# Patient Record
Sex: Male | Born: 1990 | Race: Black or African American | Hispanic: No | Marital: Single | Smoking: Current every day smoker
Health system: Southern US, Community
[De-identification: ages and names within clinical notes are randomized; demographics above are authoritative.]

## PROBLEM LIST (undated history)

## (undated) DIAGNOSIS — N200 Calculus of kidney: Secondary | ICD-10-CM

---
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
## 2011-03-23 ENCOUNTER — Emergency Department (HOSPITAL_COMMUNITY): Payer: BC Managed Care – PPO

## 2011-03-23 ENCOUNTER — Encounter: Payer: Self-pay | Admitting: Emergency Medicine

## 2011-03-23 ENCOUNTER — Emergency Department (HOSPITAL_COMMUNITY)
Admission: EM | Admit: 2011-03-23 | Discharge: 2011-03-23 | Disposition: A | Discharge status: Home / Self Care | Payer: BC Managed Care – PPO | Attending: Emergency Medicine | Admitting: Emergency Medicine

## 2011-03-23 DIAGNOSIS — R1110 Vomiting, unspecified: Secondary | ICD-10-CM

## 2011-03-23 DIAGNOSIS — R112 Nausea with vomiting, unspecified: Secondary | ICD-10-CM | POA: Insufficient documentation

## 2011-03-23 DIAGNOSIS — M546 Pain in thoracic spine: Secondary | ICD-10-CM | POA: Insufficient documentation

## 2011-03-23 DIAGNOSIS — M549 Dorsalgia, unspecified: Secondary | ICD-10-CM

## 2011-03-23 LAB — URINALYSIS, ROUTINE W REFLEX MICROSCOPIC
Bilirubin Urine: NEGATIVE
Glucose, UA: NEGATIVE mg/dL
Ketones, ur: 15 mg/dL — AB
Nitrite: NEGATIVE
Protein, ur: 30 mg/dL — AB
Specific Gravity, Urine: 1.025 (ref 1.005–1.030)
Urobilinogen, UA: 0.2 mg/dL (ref 0.0–1.0)
pH: 8 (ref 5.0–8.0)

## 2011-03-23 LAB — URINE MICROSCOPIC-ADD ON

## 2011-03-23 MED ORDER — ONDANSETRON 4 MG PO TBDP
4.0000 mg | ORAL_TABLET | Freq: Once | ORAL | Status: AC
  Administered 2011-03-23: 4 mg via ORAL
  Filled 2011-03-23: qty 1

## 2011-03-23 MED ORDER — DICYCLOMINE HCL 10 MG PO CAPS
10.0000 mg | ORAL_CAPSULE | Freq: Once | ORAL | Status: AC
  Administered 2011-03-23: 10 mg via ORAL
  Filled 2011-03-23: qty 1

## 2011-03-23 MED ORDER — ONDANSETRON HCL 4 MG PO TABS: 4.0000 mg | ORAL_TABLET | Freq: Four times a day (QID) | ORAL | Status: AC

## 2011-03-23 NOTE — ED Provider Notes (Signed)
History    20yM with n/v and back pain. Onset around 0900 today with vomiting. Multiple episodes. Triage note reviewed, but reported hx to me that around 1100 began having pain in R mid back. Worse with deep breaths and movement. Feels like something twisting. No fever or chills. Multiple episodes of vomiting. NBNB. No sick contacts. No urinary complaints. Denies hx of abdominal surgery. No significant PMHx.   CSN: [id number]  Arrival date & time 03/23/11  1252   First MD Initiated Contact with Patient 03/23/11 1447      Chief Complaint  Patient presents with  . Back Pain    (Consider location/radiation/quality/duration/timing/severity/associated sxs/prior treatment) HPI  History reviewed. No pertinent past medical history.  History reviewed. No pertinent past surgical history.  History reviewed. No pertinent family history.  History  Substance Use Topics  . Smoking status: Former Games developer  . Smokeless tobacco: Never Used  . Alcohol Use: Yes      Review of Systems   Review of symptoms negative unless otherwise noted in HPI.   Allergies  Amoxicillin  Home Medications  No current outpatient prescriptions on file.  BP 107/71  Pulse 67  Temp(Src) 97.5 F (36.4 C) (Oral)  Resp 16  SpO2 100%  Physical Exam  Nursing note and vitals reviewed. Constitutional: He appears well-developed and well-nourished. No distress.       Laying in bed. NAD.  HENT:  Head: Normocephalic and atraumatic.  Eyes: Conjunctivae are normal. Right eye exhibits no discharge. Left eye exhibits no discharge.  Neck: Neck supple.  Cardiovascular: Normal rate, regular rhythm and normal heart sounds.  Exam reveals no gallop and no friction rub.   No murmur heard. Pulmonary/Chest: Effort normal and breath sounds normal. No respiratory distress.  Abdominal: Soft. He exhibits no distension and no mass. There is no tenderness. There is no rebound and no guarding.  Genitourinary:       No cva  tenderness  Musculoskeletal: He exhibits no edema and no tenderness.  Neurological: He is alert.  Skin: Skin is warm and dry.  Psychiatric: He has a normal mood and affect. His behavior is normal. Thought content normal.    ED Course  Procedures (including critical care time)   Labs Reviewed  URINALYSIS, ROUTINE W REFLEX MICROSCOPIC   No results found.   1. Back pain   2. Vomiting       MDM  20yM with back pain and vomiting. Suspect viral illness. RBC in urine. CT done to eval for possible stone and no evidence of other explanation for symptoms. Pt feels better after tx. No continued vomiting. Nontoxic. Feel that can safely be DC'd with outpt fu as needed. Strict return precautions discussed.        Raeford Razor, MD 03/27/11 [phone]

## 2011-03-23 NOTE — ED Notes (Signed)
Pt reports left low back pain onset 1100 today with n/v.

## 2011-04-15 ENCOUNTER — Encounter (HOSPITAL_COMMUNITY): Payer: Self-pay

## 2011-04-15 ENCOUNTER — Emergency Department (INDEPENDENT_AMBULATORY_CARE_PROVIDER_SITE_OTHER)
Admission: EM | Admit: 2011-04-15 | Discharge: 2011-04-15 | Disposition: A | Discharge status: Home / Self Care | Payer: BC Managed Care – PPO | Source: Home / Self Care | Attending: Family Medicine | Admitting: Family Medicine

## 2011-04-15 DIAGNOSIS — B86 Scabies: Secondary | ICD-10-CM

## 2011-04-15 MED ORDER — PERMETHRIN 5 % EX CREA: TOPICAL_CREAM | CUTANEOUS | Status: AC

## 2011-04-15 NOTE — ED Notes (Signed)
Past couple of days, has noted reddened areas, bumps, bites on body (hands, groin, legs) NAD, has not seen any insects in home

## 2011-04-15 NOTE — ED Provider Notes (Signed)
History     CSN: [id number]  Arrival date & time 04/15/11  1123   First MD Initiated Contact with Patient 04/15/11 1258      Chief Complaint  Patient presents with  . Rash    (Consider location/radiation/quality/duration/timing/severity/associated sxs/prior treatment) Patient is a 21 y.o. male presenting with rash. The history is provided by the patient.  Rash  This is a new problem. The current episode started more than 1 week ago (3 week h/o spreading bumps.). The problem has been gradually worsening. The problem is associated with an unknown factor. There has been no fever. The rash is present on the right hand, left hand, groin, right wrist, left wrist, left fingers and right fingers. The patient is experiencing no pain. The pain has been constant since onset. Associated symptoms include itching.    History reviewed. No pertinent past medical history.  History reviewed. No pertinent past surgical history.  History reviewed. No pertinent family history.  History  Substance Use Topics  . Smoking status: Former Games developer  . Smokeless tobacco: Never Used  . Alcohol Use: Yes      Review of Systems  Constitutional: Negative.   Skin: Positive for itching and rash.    Allergies  Amoxicillin  Home Medications   Current Outpatient Rx  Name Route Sig Dispense Refill  . PERMETHRIN 5 % EX CREA  Apply over body at bedtime, wash off in am, repeat in 1 week. 60 g 1    BP 109/67  Pulse 60  Temp(Src) 98 F (36.7 C) (Oral)  Resp 14  SpO2 100%  Physical Exam  Nursing note and vitals reviewed. Constitutional: He is oriented to person, place, and time. He appears well-developed and well-nourished.  Musculoskeletal: Normal range of motion.  Neurological: He is alert and oriented to person, place, and time.  Skin: Skin is warm and dry. Rash noted. Rash is papular.       ED Course  Procedures (including critical care time)  Labs Reviewed - No data to display No results  found.   1. Scabies       MDM          Barkley Bruns, MD 04/15/11 1344

## 2011-05-21 ENCOUNTER — Encounter: Payer: Self-pay | Admitting: Family Medicine

## 2011-05-21 ENCOUNTER — Ambulatory Visit (INDEPENDENT_AMBULATORY_CARE_PROVIDER_SITE_OTHER): Payer: BC Managed Care – PPO | Admitting: Family Medicine

## 2011-05-21 VITALS — BP 112/59 | HR 67 | Temp 98.2°F | Ht 67.0 in | Wt 132.5 lb

## 2011-05-21 DIAGNOSIS — B354 Tinea corporis: Secondary | ICD-10-CM

## 2011-05-21 DIAGNOSIS — R21 Rash and other nonspecific skin eruption: Secondary | ICD-10-CM

## 2011-05-21 LAB — HIV ANTIBODY (ROUTINE TESTING W REFLEX): HIV: NONREACTIVE

## 2011-05-21 LAB — POCT SKIN KOH: Skin KOH, POC: NEGATIVE

## 2011-05-21 MED ORDER — CLOTRIMAZOLE-BETAMETHASONE 1-0.05 % EX LOTN: TOPICAL_LOTION | Freq: Two times a day (BID) | CUTANEOUS | Status: DC

## 2011-05-21 NOTE — Patient Instructions (Addendum)
Fungus Infection of the Skin An infection of your skin caused by a fungus is a very common problem. Treatment depends on which part of the body is affected.  Fungal infections may need to be treated for several weeks to be cured. SEEK MEDICAL CARE IF:   You have persistent itching or rawness.   You have an oral temperature above 102 F (38.9 C).  Document Released: 04/18/2004 Document Revised: 11/21/2010 Document Reviewed: 07/04/2009 [facility] Patient Information 2012 [place], [place].

## 2011-05-21 NOTE — Progress Notes (Signed)
  Subjective:    Patient ID: Jeffrey Bautista, male    DOB: Aug 30, 1990, 20 y.o.   MRN: [id number]  HPI  Pt that comes to establish care and his main complaint at this visit are skin lesions that had appeared since last December. They first started on his lower extremities and had been extending to his upper extremities and now on chest and and back. The lesions are very pruriginous. Patient used moisturizing lotions with no relief of symptoms. Afebrile. Good appetite. No other complaints. No urinary symptoms, normal bowel movements.   Review of Systems  Constitutional: Negative for fever, chills, activity change, appetite change and fatigue.  HENT: Negative.   Eyes: Negative.   Respiratory: Negative.   Cardiovascular: Negative.   Gastrointestinal: Negative.   Genitourinary: Negative.   Musculoskeletal: Negative.   Skin: Positive for rash.  Neurological: Negative.   Hematological: Negative.        Objective:   Physical Exam  Constitutional: He is oriented to person, place, and time. No distress.  HENT:  Mouth/Throat: Oropharynx is clear and moist.  Eyes: Conjunctivae are normal.  Neck: Neck supple.  Cardiovascular: Normal rate, regular rhythm and normal heart sounds.   No murmur heard. Pulmonary/Chest: Breath sounds normal.  Abdominal: Bowel sounds are normal. He exhibits no distension.  Musculoskeletal: He exhibits no edema.  Neurological: He is alert and oriented to person, place, and time.  Skin:       Pruritic, circular to oval, erythematous, scaling plaques with central clearing. Active advancing border erythematous and raised.    Labs HIV non reactive. KOH skin.  Negative Pt partially treated this increased false negative results.    Assessment & Plan:

## 2011-05-22 MED ORDER — FLUCONAZOLE 150 MG PO TABS: 150.0000 mg | ORAL_TABLET | Freq: Once | ORAL | Status: AC

## 2011-05-22 NOTE — Assessment & Plan Note (Addendum)
Even though skin KOH was negative pt clinically presents with lesions that resemble this condition. Pt was partially treated with some antifungal, did not completed treatment. He has lesions presents on his back (hard to reach for topical treatment) Plan: Topical Lotrisone, and fluconazole 150 mg weekly for 2 weeks. F/u in two weeks if not improvement.

## 2011-06-04 ENCOUNTER — Encounter: Payer: Self-pay | Admitting: Family Medicine

## 2011-06-04 ENCOUNTER — Ambulatory Visit (INDEPENDENT_AMBULATORY_CARE_PROVIDER_SITE_OTHER): Payer: BC Managed Care – PPO | Admitting: Family Medicine

## 2011-06-04 DIAGNOSIS — L259 Unspecified contact dermatitis, unspecified cause: Secondary | ICD-10-CM

## 2011-06-04 DIAGNOSIS — R21 Rash and other nonspecific skin eruption: Secondary | ICD-10-CM | POA: Insufficient documentation

## 2011-06-04 MED ORDER — TRIAMCINOLONE ACETONIDE 0.1 % EX CREA: TOPICAL_CREAM | Freq: Two times a day (BID) | CUTANEOUS | Status: DC

## 2011-06-04 NOTE — Assessment & Plan Note (Signed)
Vesicular rash in between fingers most likely id reaction after having extensive tinea corporis now resolved.  Also possibly contact dermatitis, less likely dyshidrotic eczema.   Not painful or red- does not look like hsv, no pustules.  Will change Lotrisone to triamcinolone for symptomatic of itching.  Given red flags for return.  Expect will continue to resolve.

## 2011-06-04 NOTE — Patient Instructions (Addendum)
Think about things that may be triggering reaction- exposure to soaps, chemicals, detergents  Use triamcinolone cream twice a day as needed to help with itching, then discontinue  Consider trying an allergy medication like zyrtec or claritin

## 2011-06-04 NOTE — Progress Notes (Signed)
  Subjective:    Patient ID: Jeffrey Bautista, male    DOB: 28-Feb-1991, 21 y.o.   MRN: [id number]  HPI 21 yo here for follow-up of rash.  States ongoing for past 2 months.  Was diagnosed with scabies at urgent care and given permethrin without improvement.  Was later seen by his PCP and dx with tinea corporis and was given oral antifungal and lotrisone and had a negative HIV test.  Patient states started off as rash on legs, arms and trunk and hands.  Now much improved but still having itching on hands, and arms which did seem to improve with lotrisone.  No pain, very itchy.  I have reviewed patient's  PMH, FH, and Social history and Medications as related to this visit. States has childhood eczema  Review of Systemsno fever, chills     Objective:   Physical Exam GEN: NAD, healthy appearing Skin:  Trunk: hyperpigmented patches consistent with areas he states were previously scaling and itchy now resolving.  Several small vesicles clustered in interdigital web spaces of right hand.  Not on erythematous base. No painful.         Assessment & Plan:

## 2012-01-22 ENCOUNTER — Ambulatory Visit (INDEPENDENT_AMBULATORY_CARE_PROVIDER_SITE_OTHER): Payer: BC Managed Care – PPO | Admitting: Family Medicine

## 2012-01-22 ENCOUNTER — Encounter: Payer: Self-pay | Admitting: Family Medicine

## 2012-01-22 VITALS — BP 99/63 | HR 55 | Temp 98.3°F | Ht 67.0 in | Wt 129.0 lb

## 2012-01-22 DIAGNOSIS — S01501A Unspecified open wound of lip, initial encounter: Secondary | ICD-10-CM

## 2012-01-22 DIAGNOSIS — S01511A Laceration without foreign body of lip, initial encounter: Secondary | ICD-10-CM

## 2012-01-22 MED ORDER — BACITRACIN 500 UNIT/GM EX OINT: 1.0000 "application " | TOPICAL_OINTMENT | Freq: Two times a day (BID) | CUTANEOUS | Status: DC
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
## 2012-01-22 NOTE — Patient Instructions (Addendum)

## 2012-01-30 ENCOUNTER — Ambulatory Visit: Payer: BC Managed Care – PPO | Admitting: Family Medicine

## 2012-02-03 ENCOUNTER — Encounter: Payer: Self-pay | Admitting: Family Medicine

## 2012-02-03 ENCOUNTER — Ambulatory Visit (INDEPENDENT_AMBULATORY_CARE_PROVIDER_SITE_OTHER): Payer: BC Managed Care – PPO | Admitting: Family Medicine

## 2012-02-03 VITALS — BP 103/61 | HR 81 | Temp 98.0°F | Ht 67.0 in | Wt 130.0 lb

## 2012-02-03 DIAGNOSIS — M5430 Sciatica, unspecified side: Secondary | ICD-10-CM

## 2012-02-03 DIAGNOSIS — M5432 Sciatica, left side: Secondary | ICD-10-CM | POA: Insufficient documentation

## 2012-02-03 MED ORDER — NAPROXEN 500 MG PO TABS: 500.0000 mg | ORAL_TABLET | Freq: Two times a day (BID) | ORAL | Status: DC

## 2012-02-03 NOTE — Progress Notes (Signed)
  Subjective:    Patient ID: Jeffrey Bautista, male    DOB: [birth year], 21 y.o.   MRN: [id number]  HPI Left leg pain starting about a week ago. Pain is sharp/shooting/stabbing. Goes from low back, side, back of leg to foot. Also c/o thigh pain lateral and medial - starts near lateral knee. Sore to touch up to groin. Walking, lifting make both pain worse. No rash. 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 Used to use back support belt/brace but doesn't any more. No new activities or known trauma to back/leg. Did have injury to food a few weeks ago. Box dropped on the back of his heel; this no longer hurts.   Review of Systems  Constitutional: Negative for fever and chills.  HENT: Negative for neck pain and neck stiffness.   Genitourinary: Negative for dysuria and difficulty urinating.  Musculoskeletal: Positive for myalgias and back pain. Negative for joint swelling and gait problem.  Neurological: Negative for dizziness, weakness and numbness.       Objective:   Physical Exam  Constitutional: He is oriented to person, place, and time. He appears well-developed and well-nourished. No distress.  HENT:  Head: Normocephalic and atraumatic.  Cardiovascular: Normal rate.   Pulmonary/Chest: Effort normal.  Musculoskeletal:       Tender over lower lumbar area - left paraspinous muscles and iliac crest. L foot, ankle, knee non-tender, normal range of motion, no deformity. Tenderness of lateral thigh muscle just above knee and of medial thigh proximally to groin. No inguinal adenopathy. No swelling, redness, warmth. No abrasions/lesions/lumps/masses or bruising.  Neurological: He is alert and oriented to person, place, and time. He exhibits normal muscle tone.  Skin: Skin is warm and dry.  Psychiatric: He has a normal mood and affect.   Tender on left lower back/flank, thigh inner and outer to knee. Calf non-tender. Full range of motno      Assessment & Plan:    21 y.o. male with lumbar muscle strain/sciatica, muscle strain of left thigh - possibly aggravated by limping due to left foot injury and work-related lifting.  Naproxen, heat, massage Decrease lifting at work for a few days to weeks Use back support belt/brace  Napoleon Form, MD 02/03/2012 4:16 PM

## 2012-02-03 NOTE — Assessment & Plan Note (Signed)
Naproxen, use support belt at work, rest from heavy lifting if possible Heat, massage.

## 2012-02-03 NOTE — Patient Instructions (Addendum)
Lumbosacral Strain Lumbosacral strain is one of the most common causes of back pain. There are many causes of back pain. Most are not serious conditions. CAUSES  Your backbone (spinal column) is made up of 24 main vertebral bodies, the sacrum, and the coccyx. These are held together by muscles and tough, fibrous tissue (ligaments). Nerve roots pass through the openings between the vertebrae. A sudden move or injury to the back may cause injury to, or pressure on, these nerves. This may result in localized back pain or pain movement (radiation) into the buttocks, down the leg, and into the foot. Sharp, shooting pain from the buttock down the back of the leg (sciatica) is frequently associated with a ruptured (herniated) disk. Pain may be caused by muscle spasm alone. Your caregiver can often find the cause of your pain by the details of your symptoms and an exam. In some cases, you may need tests (such as X-rays). Your caregiver will work with you to decide if any tests are needed based on your specific exam. HOME CARE INSTRUCTIONS   Avoid an underactive lifestyle. Active exercise, as directed by your caregiver, is your greatest weapon against back pain.  Avoid hard physical activities (tennis, racquetball, waterskiing) if you are not in proper physical condition for it. This may aggravate or create problems.  If you have a back problem, avoid sports requiring sudden body movements. Swimming and walking are generally safer activities.  Maintain good posture.  Avoid becoming overweight (obese).  Use bed rest for only the most extreme, sudden (acute) episode. Your caregiver will help you determine how much bed rest is necessary.  For acute conditions, you may put ice on the injured area.  Put ice in a plastic bag.  Place a towel between your skin and the bag.  Leave the ice on for 15 to 20 minutes at a time, every 2 hours, or as needed.  After you are improved and more active, it may help to  apply heat for 30 minutes before activities. See your caregiver if you are having pain that lasts longer than expected. Your caregiver can advise appropriate exercises or therapy if needed. With conditioning, most back problems can be avoided. SEEK IMMEDIATE MEDICAL CARE IF:   You have numbness, tingling, weakness, or problems with the use of your arms or legs.  You experience severe back pain not relieved with medicines.  There is a change in bowel or bladder control.  You have increasing pain in any area of the body, including your belly (abdomen).  You notice shortness of breath, dizziness, or feel faint.  You feel sick to your stomach (nauseous), are throwing up (vomiting), or become sweaty.  You notice discoloration of your toes or legs, or your feet get very cold.  Your back pain is getting worse.  You have a fever. MAKE SURE YOU:   Understand these instructions.  Will watch your condition.  Will get help right away if you are not doing well or get worse. Document Released: 12/19/2004 Document Revised: 06/03/2011 Document Reviewed: 06/10/2008 [facility] Patient Information 2013 [place], [place].  Muscle Strain Muscle strain occurs when a muscle is stretched beyond its normal length. A small number of muscle fibers generally are torn. This is especially common in athletes. This happens when a sudden, violent force placed on a muscle stretches it too far. Usually, recovery from muscle strain takes 1 to 2 weeks. Complete healing will take 5 to 6 weeks.  HOME CARE INSTRUCTIONS   While  awake, apply ice to the sore muscle for the first 2 days after the injury.  Put ice in a plastic bag.  Place a towel between your skin and the bag.  Leave the ice on for 15 to 20 minutes each hour.  Do not use the strained muscle for several days, until you no longer have pain.  You may wrap the injured area with an elastic bandage for comfort. Be careful not to wrap it too tightly. This may  interfere with blood circulation or increase swelling.  Only take over-the-counter or prescription medicines for pain, discomfort, or fever as directed by your caregiver. SEEK MEDICAL CARE IF:  You have increasing pain or swelling in the injured area. MAKE SURE YOU:   Understand these instructions.  Will watch your condition.  Will get help right away if you are not doing well or get worse. Document Released: 03/11/2005 Document Revised: 06/03/2011 Document Reviewed: 03/23/2011 [facility] Patient Information 2013 [place], [place].

## 2012-05-15 ENCOUNTER — Encounter (HOSPITAL_COMMUNITY): Payer: Self-pay | Admitting: *Deleted

## 2012-05-15 ENCOUNTER — Emergency Department (HOSPITAL_COMMUNITY)
Admission: EM | Admit: 2012-05-15 | Discharge: 2012-05-15 | Disposition: A | Discharge status: Home / Self Care | Payer: BC Managed Care – PPO | Attending: Emergency Medicine | Admitting: Emergency Medicine

## 2012-05-15 DIAGNOSIS — R197 Diarrhea, unspecified: Secondary | ICD-10-CM | POA: Insufficient documentation

## 2012-05-15 DIAGNOSIS — K5289 Other specified noninfective gastroenteritis and colitis: Secondary | ICD-10-CM | POA: Insufficient documentation

## 2012-05-15 DIAGNOSIS — R1032 Left lower quadrant pain: Secondary | ICD-10-CM | POA: Insufficient documentation

## 2012-05-15 DIAGNOSIS — R209 Unspecified disturbances of skin sensation: Secondary | ICD-10-CM | POA: Insufficient documentation

## 2012-05-15 DIAGNOSIS — R748 Abnormal levels of other serum enzymes: Secondary | ICD-10-CM | POA: Insufficient documentation

## 2012-05-15 DIAGNOSIS — F17200 Nicotine dependence, unspecified, uncomplicated: Secondary | ICD-10-CM | POA: Insufficient documentation

## 2012-05-15 LAB — CBC WITH DIFFERENTIAL/PLATELET
Basophils Absolute: 0 10*3/uL (ref 0.0–0.1)
Basophils Relative: 0 % (ref 0–1)
Lymphocytes Relative: 12 % (ref 12–46)
MCHC: 34.4 g/dL (ref 30.0–36.0)
Monocytes Absolute: 1.2 10*3/uL — ABNORMAL HIGH (ref 0.1–1.0)
Neutro Abs: 14 10*3/uL — ABNORMAL HIGH (ref 1.7–7.7)
Platelets: 222 10*3/uL (ref 150–400)
RDW: 13 % (ref 11.5–15.5)
WBC: 17.4 10*3/uL — ABNORMAL HIGH (ref 4.0–10.5)

## 2012-05-15 LAB — COMPREHENSIVE METABOLIC PANEL
ALT: 28 U/L (ref 0–53)
AST: 37 U/L (ref 0–37)
Albumin: 5 g/dL (ref 3.5–5.2)
CO2: 22 mEq/L (ref 19–32)
Calcium: 10.7 mg/dL — ABNORMAL HIGH (ref 8.4–10.5)
Chloride: 97 mEq/L (ref 96–112)
Creatinine, Ser: 0.92 mg/dL (ref 0.50–1.35)
GFR calc non Af Amer: >90 mL/min (ref >90)
Sodium: 138 mEq/L (ref 135–145)
Total Bilirubin: 0.4 mg/dL (ref 0.3–1.2)

## 2012-05-15 MED ORDER — ONDANSETRON HCL 4 MG PO TABS: 4.0000 mg | ORAL_TABLET | Freq: Three times a day (TID) | ORAL | Status: DC | PRN

## 2012-05-15 MED ORDER — MORPHINE SULFATE 4 MG/ML IJ SOLN
4.0000 mg | Freq: Once | INTRAMUSCULAR | Status: AC
Start: 2012-05-15 — End: 2012-05-15
  Administered 2012-05-15: 4 mg via INTRAVENOUS
  Filled 2012-05-15: qty 1

## 2012-05-15 MED ORDER — ONDANSETRON HCL 4 MG/2ML IJ SOLN
INTRAMUSCULAR | Status: AC
  Administered 2012-05-15: 09:00:00
  Filled 2012-05-15: qty 2

## 2012-05-15 MED ORDER — SODIUM CHLORIDE 0.9 % IV BOLUS (SEPSIS)
1000.0000 mL | Freq: Once | INTRAVENOUS | Status: AC
  Administered 2012-05-15: 1000 mL via INTRAVENOUS

## 2012-05-15 MED ORDER — PROMETHAZINE HCL 25 MG/ML IJ SOLN
25.0000 mg | Freq: Once | INTRAMUSCULAR | Status: AC
  Administered 2012-05-15: 25 mg via INTRAMUSCULAR
  Filled 2012-05-15: qty 1

## 2012-05-15 MED ORDER — METRONIDAZOLE 500 MG PO TABS: 500.0000 mg | ORAL_TABLET | Freq: Two times a day (BID) | ORAL | Status: DC

## 2012-05-15 MED ORDER — METOCLOPRAMIDE HCL 5 MG/ML IJ SOLN: 10.0000 mg | Freq: Once | INTRAMUSCULAR | Status: DC

## 2012-05-15 MED ORDER — SODIUM CHLORIDE 0.9 % IV BOLUS (SEPSIS): 1000.0000 mL | Freq: Once | INTRAVENOUS | Status: DC

## 2012-05-15 NOTE — ED Notes (Signed)
MD at bedside, PA

## 2012-05-15 NOTE — ED Notes (Signed)
Patient brought to ED via [facility] for nausea and vomiting since this am.  Patient thinks it was from something he ate early this am.

## 2012-05-15 NOTE — ED Notes (Signed)
Patient mentions that he is experiencing frequency in urination

## 2012-05-15 NOTE — ED Notes (Signed)
Pt with emesis x 1 after zofran.  States he does not feel well enough to go home.

## 2012-05-15 NOTE — ED Provider Notes (Signed)
Pt with acute onset of n/v/d this AM at 8 AM, he has had significant improvement after fluids and medicines but still having intermittent sx.    On my exam has soft abd with increased BS, clear heart adn lungs sounds, uncomfortable intermittently b ut well appaering, likely viral, likely infectious, pt stable for d/c with medicines for supportive care and instructions for diet  Medical screening examination/treatment/procedure(s) were conducted as a shared visit with non-physician practitioner(s) and myself.  I personally evaluated the patient during the encounter    Vida Roller, MD 05/15/12 1310

## 2012-05-15 NOTE — ED Provider Notes (Signed)
History     CSN: [id number]  Arrival date & time 05/15/12  0908   First MD Initiated Contact with Patient 05/15/12 0913      Chief Complaint  Patient presents with  . Emesis    (Consider location/radiation/quality/duration/timing/severity/associated sxs/prior treatment) HPI Comments: Pt states he ate a sub sandwich last night. Felt okay going to bed. Vomiting woke pt up vomiting. Pain is on left side. Localized, does not radiate. 10/10. Took no interventions.  Pt was BIBEMS, walked to stretcher.  Admits 8 episodes of diarrhea since 8am, numbness to bilateral lower and upper extremities, not sure if he saw blood in his diarrhea/vomiting.  Denies no difficulty urinating, no chest pain, no back pain, no sick contacts, headaches, no visual disturbances, abdominal surgery.   [facility].   Patient is a  y.o. 72 male presenting with nausea, vomiting, abdominal pain, and diarrhea  Severity: severe Onset quality: acute Duration: since 8am Timing: Constant  Progression: Unchanged  Relieved by: nothing Worsened by: Nothing tried  Ineffective treatments: None tried    Patient is a 22 y.o. male presenting with vomiting.  Emesis Associated symptoms: no diarrhea and no headaches     History reviewed. No pertinent past medical history.  History reviewed. No pertinent past surgical history.  History reviewed. No pertinent family history.  History  Substance Use Topics  . Smoking status: Current Every Day Smoker -- 1.00 packs/day    Types: Cigarettes  . Smokeless tobacco: Never Used  . Alcohol Use: Yes      Review of Systems  Constitutional: Negative for fever and diaphoresis.  HENT: Negative for neck pain and neck stiffness.   Eyes: Negative for visual disturbance.  Respiratory: Negative for apnea, chest tightness and shortness of breath.   Cardiovascular: Negative for chest pain and palpitations.  Gastrointestinal: Positive for vomiting.  Negative for nausea, diarrhea and constipation.  Genitourinary: Negative for dysuria.  Musculoskeletal: Negative for back pain and gait problem.  Skin: Negative for rash.  Neurological: Positive for numbness. Negative for dizziness, weakness, light-headedness and headaches.       Bilateral upper and lower extremities    Allergies  Amoxicillin  Home Medications  No current outpatient prescriptions on file.  BP 117/89  Pulse 62  Temp(Src) 97.4 F (36.3 C) (Oral)  Ht 5\' 8"  (1.727 m)  Wt 130 lb (58.968 kg)  BMI 19.77 kg/m2  SpO2 97%  Physical Exam  Nursing note and vitals reviewed. Constitutional: He is oriented to person, place, and time. He appears well-developed and well-nourished. No distress.  HENT:  Head: Normocephalic and atraumatic.  Eyes: Conjunctivae and EOM are normal.  Neck: Normal range of motion. Neck supple.  No meningeal signs  Cardiovascular: Normal rate, regular rhythm and normal heart sounds.  Exam reveals no gallop and no friction rub.   No murmur heard. Pulmonary/Chest: Effort normal and breath sounds normal. No respiratory distress. He has no wheezes. He has no rales. He exhibits no tenderness.  Abdominal: Soft. Bowel sounds are normal. He exhibits no distension. There is tenderness. There is guarding. There is no rebound.  Lower left quadrant pain on palpation, localized  Musculoskeletal: Normal range of motion. He exhibits no edema and no tenderness.  Neurological: He is alert and oriented to person, place, and time. No cranial nerve deficit.  No focal deficits. No pronator drift. Sensation to light touch intact.  Skin: Skin is warm and dry. He is not diaphoretic. No erythema.  ED Course  Procedures (including critical care time)  Labs Reviewed - No data to display No results found. Results for orders placed during the hospital encounter of 05/15/12  CBC WITH DIFFERENTIAL      Result Value Range   WBC 17.4 (*) 4.0 - 10.5 K/uL   RBC 5.06  4.22 -  5.81 MIL/uL   Hemoglobin 16.2  13.0 - 17.0 g/dL   HCT 40.9  81.1 - 91.4 %   MCV 93.1  78.0 - 100.0 fL   MCH 32.0  26.0 - 34.0 pg   MCHC 34.4  30.0 - 36.0 g/dL   RDW 78.2  95.6 - 21.3 %   Platelets 222  150 - 400 K/uL   Neutrophils Relative 80 (*) 43 - 77 %   Neutro Abs 14.0 (*) 1.7 - 7.7 K/uL   Lymphocytes Relative 12  12 - 46 %   Lymphs Abs 2.1  0.7 - 4.0 K/uL   Monocytes Relative 7  3 - 12 %   Monocytes Absolute 1.2 (*) 0.1 - 1.0 K/uL   Eosinophils Relative 1  0 - 5 %   Eosinophils Absolute 0.1  0.0 - 0.7 K/uL   Basophils Relative 0  0 - 1 %   Basophils Absolute 0.0  0.0 - 0.1 K/uL  COMPREHENSIVE METABOLIC PANEL      Result Value Range   Sodium 138  135 - 145 mEq/L   Potassium 3.8  3.5 - 5.1 mEq/L   Chloride 97  96 - 112 mEq/L   CO2 22  19 - 32 mEq/L   Glucose, Bld 151 (*) 70 - 99 mg/dL   BUN 12  6 - 23 mg/dL   Creatinine, Ser 0.86  0.50 - 1.35 mg/dL   Calcium 57.8 (*) 8.4 - 10.5 mg/dL   Total Protein 8.3  6.0 - 8.3 g/dL   Albumin 5.0  3.5 - 5.2 g/dL   AST 37  0 - 37 U/L   ALT 28  0 - 53 U/L   Alkaline Phosphatase 78  39 - 117 U/L   Total Bilirubin 0.4  0.3 - 1.2 mg/dL   GFR calc non Af Amer >90  >90 mL/min   GFR calc Af Amer >90  >90 mL/min  LIPASE, BLOOD      Result Value Range   Lipase 70 (*) 11 - 59 U/L     Diagnosis: colitis    MDM  Perhaps viral in etiology. Check basic labs and re-evaluate.   Re-evaluation shows white count (17.4), elevated lipase (70). With PE and HPI, suggests colitis. Patient is nontoxic, nonseptic appearing, in no apparent distress.  Patient's pain and other symptoms adequately managed in emergency department.  Fluid bolus given.  Labs and vitals reviewed.  Patient does not meet the SIRS or Sepsis criteria.  No peritoneal signs.  No indication of appendicitis, bowel obstruction, bowel perforation, cholecystitis.  Patient discharged home with Flagyl and Zofran. Discussed lab results with pt, directed pt to download and gave strict  instructions for follow-up with their primary care physician regarding abnormal lab findings.  I have also discussed reasons to return immediately to the ER.  Patient expresses understanding and agrees with plan.     Glade Nurse, PA-C 05/15/12 1419

## 2012-05-18 ENCOUNTER — Ambulatory Visit (INDEPENDENT_AMBULATORY_CARE_PROVIDER_SITE_OTHER): Payer: BC Managed Care – PPO

## 2012-05-18 ENCOUNTER — Ambulatory Visit (INDEPENDENT_AMBULATORY_CARE_PROVIDER_SITE_OTHER): Payer: BC Managed Care – PPO | Admitting: Family Medicine

## 2012-05-18 ENCOUNTER — Encounter: Payer: Self-pay | Admitting: Family Medicine

## 2012-05-18 ENCOUNTER — Telehealth: Payer: Self-pay | Admitting: Family Medicine

## 2012-05-18 VITALS — BP 98/49 | HR 61 | Temp 97.8°F | Ht 68.0 in | Wt 132.4 lb

## 2012-05-18 LAB — POCT UA - MICROSCOPIC ONLY

## 2012-05-18 LAB — POCT URINALYSIS DIPSTICK
Blood, UA: NEGATIVE
Leukocytes, UA: NEGATIVE
Nitrite, UA: NEGATIVE
Protein, UA: 30
Urobilinogen, UA: 1
pH, UA: 8

## 2012-05-18 MED ORDER — NAPROXEN 500 MG PO TABS: 500.0000 mg | ORAL_TABLET | Freq: Two times a day (BID) | ORAL | Status: DC

## 2012-05-18 MED ORDER — OMEPRAZOLE 20 MG PO CPDR: 20.0000 mg | DELAYED_RELEASE_CAPSULE | Freq: Every day | ORAL | Status: DC

## 2012-05-18 NOTE — Telephone Encounter (Signed)
Returned call to patient.  Scheduled appt on overflow clinic for today at 1:45 pm.  Gaylene Brooks, RN

## 2012-05-18 NOTE — ED Provider Notes (Signed)
  Medical screening examination/treatment/procedure(s) were conducted as a shared visit with non-physician practitioner(s) and myself.  I personally evaluated the patient during the encounter  Please see my separate respective documentation pertaining to this patient encounter   Vida Roller, MD 05/18/12 [phone]

## 2012-05-18 NOTE — Progress Notes (Signed)
Subjective:    Patient ID: Jeffrey Bautista, male    DOB: 03-11-1991, 22 y.o.   MRN: [id number]  HPI 22 y.o. male with left side lower quadrant pain for 2 months. Worse after episode of nausea/vomiting/diarrhea 3 days ago. Seen in ED. Elevated white count and calcium. Pain goes away or at least dulls over night but gets worse as soon as starts working in day time and hurts constantly all day. Pain is achy and sharp. Goes from just at ASIS to under rib cage on left. 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 Remembers an injury, hit with cart on left hip about 2 months ago. Has taken ibuprofen/tylenol off and on but doesn't help for long.   This pain seems to be different from pain in November that started in low back and radiated down left leg. That pain has now resolved.   Review of Systems  Constitutional: Negative for fever and chills.  Gastrointestinal: Negative for nausea, vomiting, diarrhea, constipation and blood in stool.  Genitourinary: Negative for dysuria, urgency, frequency and hematuria.  Musculoskeletal: Negative for myalgias, back pain and joint swelling.  Neurological: Negative for dizziness.       Objective:   Physical Exam  Constitutional: He is oriented to person, place, and time. He appears well-developed and well-nourished. No distress.  HENT:  Head: Normocephalic and atraumatic.  Eyes: Conjunctivae and EOM are normal.  Neck: Normal range of motion. Neck supple.  Cardiovascular: Normal rate, regular rhythm and normal heart sounds.   Pulmonary/Chest: Effort normal and breath sounds normal. No respiratory distress.  Abdominal: Soft. Bowel sounds are normal. He exhibits no mass. There is tenderness (mild just superior to ASIS). There is no rebound and no guarding.  Genitourinary:  No inguinal lymphadenopathy  Musculoskeletal: Normal range of motion. He exhibits no edema and no tenderness.  Neurological: He is alert and oriented to person, place, and time.   Skin: Skin is warm and dry.  Psychiatric: He has a normal mood and affect.    Filed Vitals:   05/18/12 1407  BP: 98/49  Pulse: 61  Temp: 97.8 F (36.6 C)   Results for orders placed in visit on 05/18/12 (from the past 24 hour(s))  POCT UA - MICROSCOPIC ONLY     Status: None   Collection Time    05/18/12  2:43 PM      Result Value Range   WBC, Ur, HPF, POC 0-3     RBC, urine, microscopic 0-2     Bacteria, U Microscopic TRACE     Epithelial cells, urine per micros 0-3     Amorphous, UA 2+    POCT URINALYSIS DIPSTICK     Status: Abnormal   Collection Time    05/18/12  2:43 PM      Result Value Range   Color, UA YELLOW     Clarity, UA CLEAR     Glucose, UA NEG     Bilirubin, UA NEG     Ketones, UA NEG     Spec Grav, UA 1.020     Blood, UA NEG     pH, UA 8.0     Protein, UA 30     Urobilinogen, UA 1.0     Nitrite, UA NEG     Leukocytes, UA Negative     Narrative:    Reflex to microscopic        Assessment & Plan:  22 y.o. male with  - left side pain -  likely muscle strain - UA negative for signs of calculus. - Naproxen bid x 2 weeks. And ice as possible. Pt advised to rest from work that may aggravate muscle. - Consider imaging if not improved in 2 weeks.  Isolated elevated Calcium on CMP at ED. F/U labs in a few weeks.  Napoleon Form, MD

## 2012-05-18 NOTE — Patient Instructions (Addendum)
Muscle Strain °Muscle strain occurs when a muscle is stretched beyond its normal length. A small number of muscle fibers generally are torn. This is especially common in athletes. This happens when a sudden, violent force placed on a muscle stretches it too far. Usually, recovery from muscle strain takes 1 to 2 weeks. Complete healing will take 5 to 6 weeks.  °HOME CARE INSTRUCTIONS  °· While awake, apply ice to the sore muscle for the first 2 days after the injury. °· Put ice in a plastic bag. °· Place a towel between your skin and the bag. °· Leave the ice on for 15 to 20 minutes each hour. °· Do not use the strained muscle for several days, until you no longer have pain. °· You may wrap the injured area with an elastic bandage for comfort. Be careful not to wrap it too tightly. This may interfere with blood circulation or increase swelling. °· Only take over-the-counter or prescription medicines for pain, discomfort, or fever as directed by your caregiver. °SEEK MEDICAL CARE IF:  °You have increasing pain or swelling in the injured area. °MAKE SURE YOU:  °· Understand these instructions. °· Will watch your condition. °· Will get help right away if you are not doing well or get worse. °Document Released: 03/11/2005 Document Revised: 06/03/2011 Document Reviewed: 03/23/2011 °[employer]® Patient Information ©2013 [employer]. ° °

## 2012-05-18 NOTE — Telephone Encounter (Signed)
Pt is having real bad left side pain and went to ED - was told to come in today- still having excruciating pain

## 2012-12-26 ENCOUNTER — Encounter (HOSPITAL_COMMUNITY): Payer: Self-pay | Admitting: Neurology

## 2012-12-26 ENCOUNTER — Emergency Department (HOSPITAL_COMMUNITY)
Admission: EM | Admit: 2012-12-26 | Discharge: 2012-12-26 | Disposition: A | Discharge status: Home / Self Care | Payer: BC Managed Care – PPO | Attending: Emergency Medicine | Admitting: Emergency Medicine

## 2012-12-26 DIAGNOSIS — R112 Nausea with vomiting, unspecified: Secondary | ICD-10-CM | POA: Insufficient documentation

## 2012-12-26 DIAGNOSIS — F17200 Nicotine dependence, unspecified, uncomplicated: Secondary | ICD-10-CM | POA: Insufficient documentation

## 2012-12-26 DIAGNOSIS — R197 Diarrhea, unspecified: Secondary | ICD-10-CM | POA: Insufficient documentation

## 2012-12-26 LAB — URINALYSIS, ROUTINE W REFLEX MICROSCOPIC
Hgb urine dipstick: NEGATIVE
Nitrite: NEGATIVE
Protein, ur: 100 mg/dL — AB
Urobilinogen, UA: 1 mg/dL (ref 0.0–1.0)

## 2012-12-26 LAB — URINE MICROSCOPIC-ADD ON

## 2012-12-26 LAB — CBC WITH DIFFERENTIAL/PLATELET
Basophils Absolute: 0.1 10*3/uL (ref 0.0–0.1)
Basophils Relative: 1 % (ref 0–1)
Eosinophils Absolute: 0 10*3/uL (ref 0.0–0.7)
MCH: 33.3 pg (ref 26.0–34.0)
MCHC: 36.7 g/dL — ABNORMAL HIGH (ref 30.0–36.0)
Monocytes Relative: 5 % (ref 3–12)
Neutro Abs: 11.6 10*3/uL — ABNORMAL HIGH (ref 1.7–7.7)
Neutrophils Relative %: 82 % — ABNORMAL HIGH (ref 43–77)
Platelets: 210 10*3/uL (ref 150–400)
RDW: 12.7 % (ref 11.5–15.5)

## 2012-12-26 LAB — BASIC METABOLIC PANEL
BUN: 13 mg/dL (ref 6–23)
GFR calc Af Amer: >90 mL/min (ref >90)
GFR calc non Af Amer: >90 mL/min (ref >90)
Potassium: 3.6 mEq/L (ref 3.5–5.1)
Sodium: 140 mEq/L (ref 135–145)

## 2012-12-26 LAB — HEPATIC FUNCTION PANEL
Alkaline Phosphatase: 67 U/L (ref 39–117)
Indirect Bilirubin: 0.5 mg/dL (ref 0.3–0.9)
Total Bilirubin: 0.7 mg/dL (ref 0.3–1.2)
Total Protein: 7.9 g/dL (ref 6.0–8.3)

## 2012-12-26 LAB — LIPASE, BLOOD: Lipase: 33 U/L (ref 11–59)

## 2012-12-26 MED ORDER — ONDANSETRON HCL 4 MG/2ML IJ SOLN
4.0000 mg | Freq: Once | INTRAMUSCULAR | Status: AC
  Administered 2012-12-26: 4 mg via INTRAVENOUS
  Filled 2012-12-26: qty 2

## 2012-12-26 MED ORDER — SODIUM CHLORIDE 0.9 % IV BOLUS (SEPSIS)
1000.0000 mL | Freq: Once | INTRAVENOUS | Status: AC
  Administered 2012-12-26: 1000 mL via INTRAVENOUS

## 2012-12-26 MED ORDER — ONDANSETRON HCL 4 MG PO TABS: 4.0000 mg | ORAL_TABLET | Freq: Three times a day (TID) | ORAL | Status: DC | PRN

## 2012-12-26 NOTE — ED Notes (Signed)
Pt provide sprite to attempt fluid challenge.

## 2012-12-26 NOTE — ED Notes (Signed)
Pt asked to provide urine sample. States that he cannot at this time. Urinal at bedside.

## 2012-12-26 NOTE — ED Notes (Signed)
Pt had episode of vomiting x 1. Bile in color.

## 2012-12-26 NOTE — ED Provider Notes (Signed)
CSN: [id number]     Arrival date & time 12/26/12  0910 History  This chart was scribed for non-physician practitioner, Trixie Dredge, PA-C working with Shanna Cisco, MD by Greggory Stallion, ED scribe. This patient was seen in room A09C/A09C and the patient's care was started at 9:51 AM.   Chief Complaint  Patient presents with  . Emesis  . Diarrhea   The history is provided by the patient. No language interpreter was used.    HPI Comments: Jeffrey Bautista is a 22 y.o. male who presents to the Emergency Department complaining of emesis and diarrhea that started around 4 AM this morning. Pt states he is also having left sided abdominal pain that started after the emesis and diarrhea. He states he has had 3-4 episodes of diarrhea and "1,000 episodes of emesis". Pt states the emesis has been fairly constant since 4 AM. He states he has not eaten anything different. Pt states he has not traveled recently and has not been around anyone that is sick. He denies cough, sore throat, fever, dysuria, frequency, hematuria, hematemesis, hematochezia, fever, chills and body aches.   History reviewed. No pertinent past medical history. History reviewed. No pertinent past surgical history. No family history on file. History  Substance Use Topics  . Smoking status: Current Every Day Smoker -- 0.50 packs/day    Types: Cigarettes  . Smokeless tobacco: Never Used  . Alcohol Use: Yes    Review of Systems  Constitutional: Negative for fever and chills.  HENT: Negative for sore throat.   Respiratory: Negative for cough.   Gastrointestinal: Positive for nausea, vomiting, abdominal pain and diarrhea. Negative for blood in stool.  Genitourinary: Negative for dysuria, frequency and hematuria.  All other systems reviewed and are negative.    Allergies  Amoxicillin  Home Medications  No current outpatient prescriptions on file.  BP 126/61  Pulse 64  Temp(Src) 97.4 F (36.3 C)  Resp 16  SpO2  100%  Physical Exam  Nursing note and vitals reviewed. Constitutional: He appears well-developed and well-nourished. No distress.  HENT:  Head: Normocephalic and atraumatic.  Neck: Neck supple.  Cardiovascular: Normal rate and regular rhythm.   Pulmonary/Chest: Effort normal and breath sounds normal. No respiratory distress. He has no wheezes. He has no rales.  Abdominal: Soft. He exhibits no distension and no mass. There is tenderness. There is no rebound and no guarding.  Generalized tenderness, worse on the left.   Neurological: He is alert. He exhibits normal muscle tone.  Skin: He is not diaphoretic.    ED Course  Procedures (including critical care time)  DIAGNOSTIC STUDIES: Oxygen Saturation is 100% on RA, normal by my interpretation.    COORDINATION OF CARE: 9:54 AM-Discussed treatment plan which includes blood work with pt at bedside and pt agreed to plan.   Labs Review Labs Reviewed  CBC WITH DIFFERENTIAL - Abnormal; Notable for the following:    WBC 14.2 (*)    MCHC 36.7 (*)    Neutrophils Relative % 82 (*)    Neutro Abs 11.6 (*)    All other components within normal limits  BASIC METABOLIC PANEL - Abnormal; Notable for the following:    Glucose, Bld 112 (*)    All other components within normal limits  URINALYSIS, ROUTINE W REFLEX MICROSCOPIC - Abnormal; Notable for the following:    APPearance CLOUDY (*)    pH 8.5 (*)    Ketones, ur >80 (*)    Protein, ur 100 (*)  All other components within normal limits  LIPASE, BLOOD  HEPATIC FUNCTION PANEL  URINE MICROSCOPIC-ADD ON   Imaging Review No results found.  MDM   1. Nausea vomiting and diarrhea    Pt with N/V/D and abdominal soreness, given IVF, zofran.   Labs significant only for leukocytosis.  Abdominal exam is benign.  Likely viral illness.  D/C home with zofran.  Discussed all results with patient.  Pt given return precautions.  Pt verbalizes understanding and agrees with plan.     I doubt any  other EMC precluding discharge at this time including, but not necessarily limited to the following: appendicitis, infectious colitis, cholecystitis    I personally performed the services described in this documentation, which was scribed in my presence. The recorded information has been reviewed and is accurate.   [place], PA-C 12/26/12 1547

## 2012-12-26 NOTE — ED Notes (Signed)
Pt provided sprite for PO challenge. Has not had any more vomiting since he was given the zofran.

## 2012-12-26 NOTE — ED Notes (Signed)
Patient aware urine sample is needed.  

## 2012-12-26 NOTE — ED Notes (Signed)
Per EMS- n/v/d since this morning at 0400. BP 118/86, HR 50, RR 18, 100% RA. Pt is ambulatory, a x 4.

## 2012-12-26 NOTE — ED Provider Notes (Signed)
Medical screening examination/treatment/procedure(s) were performed by non-physician practitioner and as supervising physician I was immediately available for consultation/collaboration.  Shanna Cisco, MD 12/26/12 [phone]

## 2012-12-26 NOTE — ED Notes (Signed)
Pt tolerated sprite without difficulty. 

## 2012-12-26 NOTE — ED Notes (Signed)
Spoke with main lab about hepatic function panel. Reporting it was not received in and they will add it on.

## 2013-11-24 ENCOUNTER — Emergency Department (HOSPITAL_COMMUNITY)
Admission: EM | Admit: 2013-11-24 | Discharge: 2013-11-24 | Disposition: A | Discharge status: Home / Self Care | Payer: BC Managed Care – PPO | Attending: Emergency Medicine | Admitting: Emergency Medicine

## 2013-11-24 ENCOUNTER — Encounter (HOSPITAL_COMMUNITY): Payer: Self-pay | Admitting: Emergency Medicine

## 2013-11-24 DIAGNOSIS — F17200 Nicotine dependence, unspecified, uncomplicated: Secondary | ICD-10-CM | POA: Insufficient documentation

## 2013-11-24 DIAGNOSIS — R112 Nausea with vomiting, unspecified: Secondary | ICD-10-CM

## 2013-11-24 DIAGNOSIS — R197 Diarrhea, unspecified: Secondary | ICD-10-CM | POA: Diagnosis not present

## 2013-11-24 LAB — LIPASE, BLOOD: Lipase: 19 U/L (ref 11–59)

## 2013-11-24 LAB — COMPREHENSIVE METABOLIC PANEL
ALBUMIN: 4.1 g/dL (ref 3.5–5.2)
ALK PHOS: 55 U/L (ref 39–117)
ALT: 16 U/L (ref 0–53)
AST: 27 U/L (ref 0–37)
Anion gap: 15 (ref 5–15)
BILIRUBIN TOTAL: 0.7 mg/dL (ref 0.3–1.2)
BUN: 12 mg/dL (ref 6–23)
CHLORIDE: 104 meq/L (ref 96–112)
CO2: 23 mEq/L (ref 19–32)
Calcium: 9 mg/dL (ref 8.4–10.5)
Creatinine, Ser: 0.96 mg/dL (ref 0.50–1.35)
GFR calc Af Amer: >90 mL/min (ref >90)
GFR calc non Af Amer: >90 mL/min (ref >90)
GLUCOSE: 98 mg/dL (ref 70–99)
POTASSIUM: 4.4 meq/L (ref 3.7–5.3)
SODIUM: 142 meq/L (ref 137–147)
Total Protein: 6.8 g/dL (ref 6.0–8.3)

## 2013-11-24 LAB — CBC
HCT: 41.5 % (ref 39.0–52.0)
HEMOGLOBIN: 14.2 g/dL (ref 13.0–17.0)
MCH: 31.7 pg (ref 26.0–34.0)
MCHC: 34.2 g/dL (ref 30.0–36.0)
MCV: 92.6 fL (ref 78.0–100.0)
Platelets: 189 10*3/uL (ref 150–400)
RBC: 4.48 MIL/uL (ref 4.22–5.81)
RDW: 13.1 % (ref 11.5–15.5)
WBC: 11.8 10*3/uL — ABNORMAL HIGH (ref 4.0–10.5)

## 2013-11-24 LAB — CBG MONITORING, ED: Glucose-Capillary: 97 mg/dL (ref 70–99)

## 2013-11-24 MED ORDER — ONDANSETRON HCL 4 MG/2ML IJ SOLN
4.0000 mg | Freq: Once | INTRAMUSCULAR | Status: AC
  Administered 2013-11-24: 4 mg via INTRAVENOUS
  Filled 2013-11-24: qty 2

## 2013-11-24 MED ORDER — DICYCLOMINE HCL 20 MG PO TABS: 20.0000 mg | ORAL_TABLET | Freq: Three times a day (TID) | ORAL | Status: DC | PRN

## 2013-11-24 MED ORDER — DICYCLOMINE HCL 10 MG PO CAPS
10.0000 mg | ORAL_CAPSULE | Freq: Once | ORAL | Status: AC
  Administered 2013-11-24: 10 mg via ORAL
  Filled 2013-11-24: qty 1

## 2013-11-24 MED ORDER — SODIUM CHLORIDE 0.9 % IV BOLUS (SEPSIS)
1000.0000 mL | Freq: Once | INTRAVENOUS | Status: AC
  Administered 2013-11-24: 1000 mL via INTRAVENOUS

## 2013-11-24 MED ORDER — MORPHINE SULFATE 4 MG/ML IJ SOLN
4.0000 mg | Freq: Once | INTRAMUSCULAR | Status: AC
  Administered 2013-11-24: 4 mg via INTRAVENOUS
  Filled 2013-11-24: qty 1

## 2013-11-24 MED ORDER — ONDANSETRON 8 MG PO TBDP: 8.0000 mg | ORAL_TABLET | Freq: Three times a day (TID) | ORAL | Status: DC | PRN

## 2013-11-24 NOTE — ED Notes (Signed)
Bed: [facility]84 Expected date:  Expected time:  Means of arrival:  Comments: EMS awake after narcan

## 2013-11-24 NOTE — ED Notes (Addendum)
Per EMS pt co of abdominal cramping, N/V/D. Pt has Hx of recurrent N/v about every 4 mounths. Pt was seen multiple times for same symptoms with no confirmed Dx. Pt received 4 mg of Zofran at 1155. Vomited once while transport.

## 2013-11-24 NOTE — ED Provider Notes (Signed)
CSN: [id number]     Arrival date & time 11/24/13  1220 History   First MD Initiated Contact with Patient 11/24/13 1232     Chief Complaint  Patient presents with  . Emesis      HPI Patient reports nausea vomiting and diarrhea since this morning.  He states abdominal cramping.  This is occurred in several times before in the past.denies fevers and chills.  No recent sick contacts.  Denies chest pain shortness breath.  No flank pain.  No urinary symptoms.  Vomitus described as nonbloody.symptoms are moderate in severity   History reviewed. No pertinent past medical history. History reviewed. No pertinent past surgical history. No family history on file. History  Substance Use Topics  . Smoking status: Current Every Day Smoker -- 0.50 packs/day    Types: Cigarettes  . Smokeless tobacco: Never Used  . Alcohol Use: Yes    Review of Systems  All other systems reviewed and are negative.     Allergies  Amoxicillin  Home Medications   Prior to Admission medications   Not on File   BP 123/63  Pulse 51  Temp(Src) 97.5 F (36.4 C) (Oral)  Resp 24  SpO2 100% Physical Exam  Nursing note and vitals reviewed. Constitutional: He is oriented to person, place, and time. He appears well-developed and well-nourished.  HENT:  Head: Normocephalic and atraumatic.  Eyes: EOM are normal.  Neck: Normal range of motion.  Cardiovascular: Normal rate, regular rhythm, normal heart sounds and intact distal pulses.   Pulmonary/Chest: Effort normal and breath sounds normal. No respiratory distress.  Abdominal: Soft. He exhibits no distension. There is no tenderness.  Musculoskeletal: Normal range of motion.  Neurological: He is alert and oriented to person, place, and time.  Skin: Skin is warm and dry.  Psychiatric: He has a normal mood and affect. Judgment normal.    ED Course  Procedures (including critical care time) Labs Review Labs Reviewed  CBC - Abnormal; Notable for the  following:    WBC 11.8 (*)    All other components within normal limits  COMPREHENSIVE METABOLIC PANEL  LIPASE, BLOOD  CBG MONITORING, ED    Imaging Review No results found.   EKG Interpretation None      MDM   Final diagnoses:  Nausea vomiting and diarrhea    2:32 PM Patient feels much better at this time after Zofran and IV hydration.  Repeat abdominal exam is benign.  Likely viral process.  Discharge home in good condition.    Lyanne Co, MD 11/24/13 [phone]

## 2013-12-02 ENCOUNTER — Ambulatory Visit: Payer: BC Managed Care – PPO | Admitting: Family Medicine

## 2014-01-04 ENCOUNTER — Encounter (HOSPITAL_COMMUNITY): Payer: Self-pay | Admitting: Emergency Medicine

## 2014-01-04 ENCOUNTER — Emergency Department (HOSPITAL_COMMUNITY)
Admission: EM | Admit: 2014-01-04 | Discharge: 2014-01-04 | Disposition: A | Discharge status: Home / Self Care | Payer: BC Managed Care – PPO | Attending: Emergency Medicine | Admitting: Emergency Medicine

## 2014-01-04 DIAGNOSIS — R21 Rash and other nonspecific skin eruption: Secondary | ICD-10-CM | POA: Insufficient documentation

## 2014-01-04 DIAGNOSIS — Z720 Tobacco use: Secondary | ICD-10-CM | POA: Insufficient documentation

## 2014-01-04 DIAGNOSIS — Z880 Allergy status to penicillin: Secondary | ICD-10-CM | POA: Diagnosis not present

## 2014-01-04 DIAGNOSIS — R369 Urethral discharge, unspecified: Secondary | ICD-10-CM | POA: Diagnosis not present

## 2014-01-04 DIAGNOSIS — M79672 Pain in left foot: Secondary | ICD-10-CM | POA: Diagnosis not present

## 2014-01-04 LAB — URINALYSIS, ROUTINE W REFLEX MICROSCOPIC
Bilirubin Urine: NEGATIVE
GLUCOSE, UA: NEGATIVE mg/dL
Ketones, ur: NEGATIVE mg/dL
Nitrite: NEGATIVE
PH: 6.5 (ref 5.0–8.0)
Protein, ur: NEGATIVE mg/dL
SPECIFIC GRAVITY, URINE: 1.007 (ref 1.005–1.030)
UROBILINOGEN UA: 0.2 mg/dL (ref 0.0–1.0)

## 2014-01-04 LAB — URINE MICROSCOPIC-ADD ON

## 2014-01-04 MED ORDER — TRAMADOL HCL 50 MG PO TABS: 50.0000 mg | ORAL_TABLET | Freq: Four times a day (QID) | ORAL | Status: DC | PRN

## 2014-01-04 MED ORDER — AZITHROMYCIN 250 MG PO TABS
1000.0000 mg | ORAL_TABLET | Freq: Once | ORAL | Status: AC
  Administered 2014-01-04: 1000 mg via ORAL
  Filled 2014-01-04: qty 4

## 2014-01-04 MED ORDER — CEFTRIAXONE SODIUM 250 MG IJ SOLR
250.0000 mg | Freq: Once | INTRAMUSCULAR | Status: AC
  Administered 2014-01-04: 250 mg via INTRAMUSCULAR
  Filled 2014-01-04: qty 250

## 2014-01-04 MED ORDER — IBUPROFEN 600 MG PO TABS: 600.0000 mg | ORAL_TABLET | Freq: Four times a day (QID) | ORAL | Status: DC | PRN

## 2014-01-04 NOTE — ED Notes (Signed)
Discussed with patient that he needs to submit urine sample.

## 2014-01-04 NOTE — ED Notes (Signed)
Pt. requesting STD screening reports  penile discharge onset today and left foot pain for 3 months denies injury/ambulatory.

## 2014-01-04 NOTE — Discharge Instructions (Signed)
Corns and Calluses Corns are small areas of thickened skin that usually occur on the top, sides, or tip of a toe. They contain a cone-shaped core with a point that can press on a nerve below. This causes pain. Calluses are areas of thickened skin that usually develop on hands, fingers, palms, soles of the feet, and heels. These are areas that experience frequent friction or pressure. CAUSES  Corns are usually the result of rubbing (friction) or pressure from shoes that are too tight or do not fit properly. Calluses are caused by repeated friction and pressure on the affected areas. SYMPTOMS  A hard growth on the skin.  Pain or tenderness under the skin.  Sometimes, redness and swelling.  Increased discomfort while wearing tight-fitting shoes. DIAGNOSIS  Your caregiver can usually tell what the problem is by doing a physical exam. TREATMENT  Removing the cause of the friction or pressure is usually the only treatment needed. However, sometimes medicines can be used to help soften the hardened, thickened areas. These medicines include salicylic acid plasters and 12% ammonium lactate lotion. These medicines should only be used under the direction of your caregiver. HOME CARE INSTRUCTIONS   Try to remove pressure from the affected area.  You may wear donut-shaped corn pads to protect your skin.  You may use a pumice stone or nonmetallic nail file to gently reduce the thickness of a corn.  Wear properly fitted footwear.  If you have calluses on the hands, wear gloves during activities that cause friction.  If you have diabetes, you should regularly examine your feet. Tell your caregiver if you notice any problems with your feet. SEEK IMMEDIATE MEDICAL CARE IF:   You have increased pain, swelling, redness, or warmth in the affected area.  Your corn or callus starts to drain fluid or bleeds.  You are not getting better, even with treatment. Document Released: 12/16/2003 Document  Revised: 06/03/2011 Document Reviewed: 11/06/2010 [facility] Patient Information 2015 [place], [place]. This information is not intended to replace advice given to you by your health care provider. Make sure you discuss any questions you have with your health care provider. Sexually Transmitted Disease A sexually transmitted disease (STD) is a disease or infection that may be passed (transmitted) from person to person, usually during sexual activity. This may happen by way of saliva, semen, blood, vaginal mucus, or urine. Common STDs include:   Gonorrhea.   Chlamydia.   Syphilis.   HIV and AIDS.   Genital herpes.   Hepatitis B and C.   Trichomonas.   Human papillomavirus (HPV).   Pubic lice.   Scabies.  Mites.  Bacterial vaginosis. WHAT ARE CAUSES OF STDs? An STD may be caused by bacteria, a virus, or parasites. STDs are often transmitted during sexual activity if one person is infected. However, they may also be transmitted through nonsexual means. STDs may be transmitted after:   Sexual intercourse with an infected person.   Sharing sex toys with an infected person.   Sharing needles with an infected person or using unclean piercing or tattoo needles.  Having intimate contact with the genitals, mouth, or rectal areas of an infected person.   Exposure to infected fluids during birth. WHAT ARE THE SIGNS AND SYMPTOMS OF STDs? Different STDs have different symptoms. Some people may not have any symptoms. If symptoms are present, they may include:   Painful or bloody urination.   Pain in the pelvis, abdomen, vagina, anus, throat, or eyes.   A skin rash,  itching, or irritation.  Growths, ulcerations, blisters, or sores in the genital and anal areas.  Abnormal vaginal discharge with or without bad odor.   Penile discharge in men.   Fever.   Pain or bleeding during sexual intercourse.   Swollen glands in the groin area.   Yellow skin and eyes  (jaundice). This is seen with hepatitis.   Swollen testicles.  Infertility.  Sores and blisters in the mouth. HOW ARE STDs DIAGNOSED? To make a diagnosis, your health care provider may:   Take a medical history.   Perform a physical exam.   Take a sample of any discharge to examine.  Swab the throat, cervix, opening to the penis, rectum, or vagina for testing.  Test a sample of your first morning urine.   Perform blood tests.   Perform a Pap test, if this applies.   Perform a colposcopy.   Perform a laparoscopy.  HOW ARE STDs TREATED? Treatment depends on the STD. Some STDs may be treated but not cured.   Chlamydia, gonorrhea, trichomonas, and syphilis can be cured with antibiotic medicine.   Genital herpes, hepatitis, and HIV can be treated, but not cured, with prescribed medicines. The medicines lessen symptoms.   Genital warts from HPV can be treated with medicine or by freezing, burning (electrocautery), or surgery. Warts may come back.   HPV cannot be cured with medicine or surgery. However, abnormal areas may be removed from the cervix, vagina, or vulva.   If your diagnosis is confirmed, your recent sexual partners need treatment. This is true even if they are symptom-free or have a negative culture or evaluation. They should not have sex until their health care providers say it is okay. HOW CAN I REDUCE MY RISK OF GETTING AN STD? Take these steps to reduce your risk of getting an STD:  Use latex condoms, dental dams, and water-soluble lubricants during sexual activity. Do not use petroleum jelly or oils.  Avoid having multiple sex partners.  Do not have sex with someone who has other sex partners.  Do not have sex with anyone you do not know or who is at high risk for an STD.  Avoid risky sex practices that can break your skin.  Do not have sex if you have open sores on your mouth or skin.  Avoid drinking too much alcohol or taking illegal  drugs. Alcohol and drugs can affect your judgment and put you in a vulnerable position.  Avoid engaging in oral and anal sex acts.  Get vaccinated for HPV and hepatitis. If you have not received these vaccines in the past, talk to your health care provider about whether one or both might be right for you.   If you are at risk of being infected with HIV, it is recommended that you take a prescription medicine daily to prevent HIV infection. This is called pre-exposure prophylaxis (PrEP). You are considered at risk if:  You are a man who has sex with other men (MSM).  You are a heterosexual man or woman and are sexually active with more than one partner.  You take drugs by injection.  You are sexually active with a partner who has HIV.  Talk with your health care provider about whether you are at high risk of being infected with HIV. If you choose to begin PrEP, you should first be tested for HIV. You should then be tested every 3 months for as long as you are taking PrEP.  WHAT SHOULD I  DO IF I THINK I HAVE AN STD?  See your health care provider.   Tell your sexual partner(s). They should be tested and treated for any STDs.  Do not have sex until your health care provider says it is okay. WHEN SHOULD I GET IMMEDIATE MEDICAL CARE? Contact your health care provider right away if:   You have severe abdominal pain.  You are a man and notice swelling or pain in your testicles.  You are a woman and notice swelling or pain in your vagina. Document Released: 06/01/2002 Document Revised: 03/16/2013 Document Reviewed: 09/29/2012 [facility] Patient Information 2015 [place], [place]. This information is not intended to replace advice given to you by your health care provider. Make sure you discuss any questions you have with your health care provider.

## 2014-01-05 LAB — GC/CHLAMYDIA PROBE AMP
CT Probe RNA: NEGATIVE
GC Probe RNA: POSITIVE — AB

## 2014-01-06 ENCOUNTER — Telehealth (HOSPITAL_COMMUNITY): Payer: Self-pay

## 2014-01-06 NOTE — ED Provider Notes (Signed)
CSN: [id number]     Arrival date & time 01/04/14  0231 History   First MD Initiated Contact with Patient 01/04/14 0413     Chief Complaint  Patient presents with  . Penile Discharge  . Foot Pain     (Consider location/radiation/quality/duration/timing/severity/associated sxs/prior Treatment) HPI Comments: Pt comes in with cc of foot pain and penile discharge. Penile discharge started today. Pt and his partner in the roof, both deny having sexual encounters/relationshipwith anyone else and girl friend reports not having any symptoms. Pt has some dysuria at the end of the stream. No scrotal pain, penile rash. Also has foot pain.  Pt has a callus in hisL foot x 3 months, pain worse with walking.  Patient is a 23 y.o. male presenting with penile discharge and lower extremity pain. The history is provided by the patient.  Penile Discharge  Foot Pain    History reviewed. No pertinent past medical history. History reviewed. No pertinent past surgical history. No family history on file. History  Substance Use Topics  . Smoking status: Current Every Day Smoker -- 0.50 packs/day    Types: Cigarettes  . Smokeless tobacco: Never Used  . Alcohol Use: Yes    Review of Systems  Constitutional: Negative for fever and chills.  Genitourinary: Positive for discharge.  Musculoskeletal: Positive for gait problem. Negative for joint swelling.  Skin: Positive for rash.  Allergic/Immunologic: Negative for immunocompromised state.      Allergies  Amoxicillin  Home Medications   Prior to Admission medications   Medication Sig Start Date End Date Taking? Authorizing Provider  ibuprofen (ADVIL,MOTRIN) 600 MG tablet Take 1 tablet (600 mg total) by mouth every 6 (six) hours as needed. 01/04/14   Derwood KaplanAnkit Breslin Hemann, MD  traMADol (ULTRAM) 50 MG tablet Take 1 tablet (50 mg total) by mouth every 6 (six) hours as needed. 01/04/14   Serine Kea Rhunette CroftNanavati, MD   BP 123/60  Pulse 68  Temp(Src) 99.7 F (37.6  C) (Oral)  Resp 20  Ht 5\' 8"  (1.727 m)  Wt 130 lb (58.968 kg)  BMI 19.77 kg/m2  SpO2 100% Physical Exam  Nursing note and vitals reviewed. Constitutional: He appears well-developed.  HENT:  Head: Atraumatic.  Cardiovascular: Normal rate.   Pulmonary/Chest: Effort normal.  Abdominal: Soft. He exhibits no distension. There is no tenderness.  Genitourinary: Penis normal.  PT has mild discharge from the urethra. Norash/erythema. Scrotal exam is normal and pt has mild inguinal lymphadenopathy.    ED Course  Procedures (including critical care time) Labs Review Labs Reviewed  GC/CHLAMYDIA PROBE AMP - Abnormal; Notable for the following:    GC Probe RNA POSITIVE (*)    All other components within normal limits  URINALYSIS, ROUTINE W REFLEX MICROSCOPIC - Abnormal; Notable for the following:    APPearance CLOUDY (*)    Hgb urine dipstick SMALL (*)    Leukocytes, UA LARGE (*)    All other components within normal limits  URINE MICROSCOPIC-ADD ON    Imaging Review No results found.   EKG Interpretation None      MDM   Final diagnoses:  Penile discharge    Pt with penile discharge. GC and Chlamydia meds given. UA has pyuria - likely from STD as well. Foot  - callus,no infection.    Derwood KaplanAnkit Beata Beason, MD 01/06/14 1432

## 2014-01-07 ENCOUNTER — Other Ambulatory Visit: Payer: Self-pay

## 2014-04-07 ENCOUNTER — Encounter: Payer: Self-pay | Admitting: Family Medicine

## 2014-04-07 ENCOUNTER — Ambulatory Visit (INDEPENDENT_AMBULATORY_CARE_PROVIDER_SITE_OTHER): Payer: BC Managed Care – PPO | Admitting: Family Medicine

## 2014-04-07 VITALS — BP 82/50 | HR 68 | Ht 68.0 in | Wt 131.9 lb

## 2014-04-07 DIAGNOSIS — G5601 Carpal tunnel syndrome, right upper limb: Secondary | ICD-10-CM

## 2014-04-07 DIAGNOSIS — G5602 Carpal tunnel syndrome, left upper limb: Secondary | ICD-10-CM

## 2014-04-07 DIAGNOSIS — G5600 Carpal tunnel syndrome, unspecified upper limb: Secondary | ICD-10-CM | POA: Insufficient documentation

## 2014-04-07 DIAGNOSIS — G5603 Carpal tunnel syndrome, bilateral upper limbs: Secondary | ICD-10-CM

## 2014-04-07 NOTE — Patient Instructions (Signed)
Thank you so much for coming to visit me today! I will write a prescription for a splint for both of your hands. Please take ibuprofen for the inflammation. Follow up in one month if no improvement.  Thanks again! Dr. Caroleen Hamman  Carpal Tunnel Syndrome Carpal tunnel syndrome is a disorder of the nervous system in the wrist that causes pain, hand weakness, and/or loss of feeling. Carpal tunnel syndrome is caused by the compression, stretching, or irritation of the median nerve at the wrist joint. Athletes who experience carpal tunnel syndrome may notice a decrease in their performance to the condition, especially for sports that require strong hand or wrist action.  SYMPTOMS   Tingling, numbness, or burning pain in the hand or fingers.  Inability to sleep due to pain in the hand.  Sharp pains that shoot from the wrist up the arm or to the fingers, especially at night.  Morning stiffness or cramping of the hand.  Thumb weakness, resulting in difficulty holding objects or making a fist.  Shiny, dry skin on the hand.  Reduced performance in any sport requiring a strong grip. CAUSES   Median nerve damage at the wrist is caused by pressure due to swelling, inflammation, or scarred tissue.  Sources of pressure include:  Repetitive gripping or squeezing that causes inflammation of the tendon sheaths.  Scarring or shortening of the ligament that covers the median nerve.  Traumatic injury to the wrist or forearm such as fracture, sprain, or dislocation.  Prolonged hyperextension (wrist bent backward) or hyperflexion (wrist bent downward) of the wrist. RISK INCREASES WITH:  Diabetes mellitus.  Menopause or amenorrhea.  Rheumatoid arthritis.  Raynaud disease.  Pregnancy.  Gout.  Kidney disease.  Ganglion cyst.  Repetitive hand or wrist action.  Hypothyroidism (underactive thyroid gland).  Repetitive jolting or shaking of the hands or wrist.  Prolonged forceful  weight-bearing on the hands. PREVENTION  Bracing the hand and wrist straight during activities that involve repetitive grasping.  For activities that require prolonged extension of the wrist (bending towards the top of the forearm) periodically change the position of your wrists.  Learn and use proper technique in activities that result in the wrist position in neutral to slight extension.  Avoid bending the wrist into full extension or flexion (up or down).  Keep the wrist in a straight (neutral) position. To keep the wrist in this position, wear a splint.  Avoid repetitive hand and wrist motions.  When possible avoid prolonged grasping of items (steering wheel of a car, a pen, a vacuum cleaner, or a rake).  Loosen your grip for activities that require prolonged grasping of items.  Place keyboards and writing surfaces at the correct height as to decrease strain on the wrist and hand.  Alternate work tasks to avoid prolonged wrist flexion.  Avoid pinching activities (needlework and writing) as they may irritate your carpal tunnel syndrome.  If these activities are necessary, complete them for shorter periods of time.  When writing, use a felt tip or rollerball pen and/or build up the grip on a pen to decrease the forces required for writing. PROGNOSIS  Carpal tunnel syndrome is usually curable with appropriate conservative treatment and sometimes resolves spontaneously. For some cases, surgery is necessary, especially if muscle wasting or nerve changes have developed.  RELATED COMPLICATIONS   Permanent numbness and a weak thumb or fingers in the affected hand.  Permanent paralysis of a portion of the hand and fingers. TREATMENT  Treatment initially consists of stopping  activities that aggravate the symptoms as well as medication and ice to reduce inflammation. A wrist splint is often recommended for wear during activities of repetitive motion as well as at night. It is also  important to learn and use proper technique when performing activities that typically cause pain. On occasion, a corticosteroid injection may be given. If symptoms persist despite conservative treatment, surgery may be an option. Surgical techniques free the pinched or compressed nerve. Carpal tunnel surgery is usually performed on an outpatient basis, meaning you go home the same day as surgery. These procedures provide almost complete relief of all symptoms in 95% of patients. Expect at least 2 weeks for healing after surgery. For cases that are the result of repeated jolting or shaking of the hand or wrist or prolonged hyperextension, surgery is not usually recommended because stretching of the median nerve, not compression, is usually the cause of carpal tunnel syndrome in these cases. MEDICATION   If pain medication is necessary, nonsteroidal anti-inflammatory medications, such as aspirin and ibuprofen, or other minor pain relievers, such as acetaminophen, are often recommended.  Do not take pain medication for 7 days before surgery.  Prescription pain relievers are usually only prescribed after surgery. Use only as directed and only as much as you need.  Corticosteroid injections may be given to reduce inflammation. However, they are not always recommended.  Vitamin B6 (pyridoxine) may reduce symptoms; use only if prescribed for your disorder. SEEK MEDICAL CARE IF:   Symptoms get worse or do not improve in 2 weeks despite treatment.  You also have a current or recent history of neck or shoulder injury that has resulted in pain or tingling elsewhere in your arm. Document Released: 03/11/2005 Document Revised: 07/26/2013 Document Reviewed: 06/23/2008 [facility] Patient Information 2015 [place], [place]. This information is not intended to replace advice given to you by your health care provider. Make sure you discuss any questions you have with your health care provider.

## 2014-04-07 NOTE — Progress Notes (Signed)
Subjective:     Patient ID: Jeffrey ChardJohn S Bautista, male   DOB: 1990/12/17, 24 y.o.   MRN: [id number]  HPI Jeffrey Bautista is a 24yo male presenting today for occasional numbness in hands bilaterally. - Numbness begins of palm and then spreads to fingers and up arm - Denies pain or weakness - Has noted over 2 months. Worsening - Not influenced by weather - Does not occur when sleeping - Left >Right - Has not tried any medications  - Numbness lasts 30 seconds to one minute and occur every other day - Worse with pressure, such as laying back on hands or carrying/lifting different things. - Has affected work. He works at [employer].  Review of Systems  Musculoskeletal: Negative for myalgias, neck pain and neck stiffness.       Objective:   Physical Exam  Cardiovascular: Normal rate and regular rhythm.  Exam reveals no gallop and no friction rub.   No murmur heard. Pulmonary/Chest: No respiratory distress. He has no wheezes. He has no rales.  Musculoskeletal:  Modified Tinels-Phalen negative. Tinels negative. Phalen positive on Right. No tenderness to palpation of hand. No decreased range of motion of elbow, shoulder, or neck. Negative Spurlings.       Assessment:     Please refer to Problem List for Assessment.    Plan:     Please refer to Problem List for Plan.

## 2014-04-07 NOTE — Assessment & Plan Note (Signed)
-   Bilateral, L>R symptomatically however Right positive with Phalen's. - Written prescription given for right and left Cock-Up splint to be worn during repetitive activity and at night - Recommend NSAID for pain/inflammation - Return in one month if no improvement. Consider TSH at next visit if no improvement.

## 2014-05-03 ENCOUNTER — Emergency Department (HOSPITAL_COMMUNITY)
Admission: EM | Admit: 2014-05-03 | Discharge: 2014-05-03 | Disposition: A | Discharge status: Home / Self Care | Payer: BC Managed Care – PPO | Source: Home / Self Care | Attending: Family Medicine | Admitting: Family Medicine

## 2014-05-03 ENCOUNTER — Encounter (HOSPITAL_COMMUNITY): Payer: Self-pay | Admitting: *Deleted

## 2014-05-03 DIAGNOSIS — M792 Neuralgia and neuritis, unspecified: Secondary | ICD-10-CM | POA: Diagnosis not present

## 2014-05-03 MED ORDER — AMITRIPTYLINE HCL 50 MG PO TABS: 50.0000 mg | ORAL_TABLET | Freq: Every day | ORAL | Status: DC

## 2014-05-03 NOTE — Discharge Instructions (Signed)
Try amitriptyline 1 pill each night, you may increase to 2 pills per night after 10 days.    Neuropathic Pain We often think that pain has a physical cause. If we get rid of the cause, the pain should go away. Nerves themselves can also cause pain. It is called neuropathic pain, which means nerve abnormality. It may be difficult for the patients who have it and for the treating caregivers. Pain is usually described as acute (short-lived) or chronic (long-lasting). Acute pain is related to the physical sensations caused by an injury. It can last from a few seconds to many weeks, but it usually goes away when normal healing occurs. Chronic pain lasts beyond the typical healing time. With neuropathic pain, the nerve fibers themselves may be damaged or injured. They then send incorrect signals to other pain centers. The pain you feel is real, but the cause is not easy to find.  CAUSES  Chronic pain can result from diseases, such as diabetes and shingles (an infection related to chickenpox), or from trauma, surgery, or amputation. It can also happen without any known injury or disease. The nerves are sending pain messages, even though there is no identifiable cause for such messages.   Other common causes of neuropathy include diabetes, phantom limb pain, or Regional Pain Syndrome (RPS).  As with all forms of chronic back pain, if neuropathy is not correctly treated, there can be a number of associated problems that lead to a downward cycle for the patient. These include depression, sleeplessness, feelings of fear and anxiety, limited social interaction and inability to do normal daily activities or work.  The most dramatic and mysterious example of neuropathic pain is called "phantom limb syndrome." This occurs when an arm or a leg has been removed because of illness or injury. The brain still gets pain messages from the nerves that originally carried impulses from the missing limb. These nerves now seem to  misfire and cause troubling pain.  Neuropathic pain often seems to have no cause. It responds poorly to standard pain treatment. Neuropathic pain can occur after:  Shingles (herpes zoster virus infection).  A lasting burning sensation of the skin, caused usually by injury to a peripheral nerve.  Peripheral neuropathy which is widespread nerve damage, often caused by diabetes or alcoholism.  Phantom limb pain following an amputation.  Facial nerve problems (trigeminal neuralgia).  Multiple sclerosis.  Reflex sympathetic dystrophy.  Pain which comes with cancer and cancer chemotherapy.  Entrapment neuropathy such as when pressure is put on a nerve such as in carpal tunnel syndrome.  Back, leg, and hip problems (sciatica).  Spine or back surgery.  HIV Infection or AIDS where nerves are infected by viruses. Your caregiver can explain items in the above list which may apply to you. SYMPTOMS  Characteristics of neuropathic pain are:  Severe, sharp, electric shock-like, shooting, lightening-like, knife-like.  Pins and needles sensation.  Deep burning, deep cold, or deep ache.  Persistent numbness, tingling, or weakness.  Pain resulting from light touch or other stimulus that would not usually cause pain.  Increased sensitivity to something that would normally cause pain, such as a pinprick. Pain may persist for months or years following the healing of damaged tissues. When this happens, pain signals no longer sound an alarm about current injuries or injuries about to happen. Instead, the alarm system itself is not working correctly.  Neuropathic pain may get worse instead of better over time. For some people, it can lead to serious  disability. It is important to be aware that severe injury in a limb can occur without a proper, protective pain response.Burns, cuts, and other injuries may go unnoticed. Without proper treatment, these injuries can become infected or lead to further  disability. Take any injury seriously, and consult your caregiver for treatment. DIAGNOSIS  When you have a pain with no known cause, your caregiver will probably ask some specific questions:   Do you have any other conditions, such as diabetes, shingles, multiple sclerosis, or HIV infection?  How would you describe your pain? (Neuropathic pain is often described as shooting, stabbing, burning, or searing.)  Is your pain worse at any time of the day? (Neuropathic pain is usually worse at night.)  Does the pain seem to follow a certain physical pathway?  Does the pain come from an area that has missing or injured nerves? (An example would be phantom limb pain.)  Is the pain triggered by minor things such as rubbing against the sheets at night? These questions often help define the type of pain involved. Once your caregiver knows what is happening, treatment can begin. Anticonvulsant, antidepressant drugs, and various pain relievers seem to work in some cases. If another condition, such as diabetes is involved, better management of that disorder may relieve the neuropathic pain.  TREATMENT  Neuropathic pain is frequently long-lasting and tends not to respond to treatment with narcotic type pain medication. It may respond well to other drugs such as antiseizure and antidepressant medications. Usually, neuropathic problems do not completely go away, but partial improvement is often possible with proper treatment. Your caregivers have large numbers of medications available to treat you. Do not be discouraged if you do not get immediate relief. Sometimes different medications or a combination of medications will be tried before you receive the results you are hoping for. See your caregiver if you have pain that seems to be coming from nowhere and does not go away. Help is available.  SEEK IMMEDIATE MEDICAL CARE IF:   There is a sudden change in the quality of your pain, especially if the change is on  only one side of the body.  You notice changes of the skin, such as redness, black or purple discoloration, swelling, or an ulcer.  You cannot move the affected limbs. Document Released: 12/07/2003 Document Revised: 06/03/2011 Document Reviewed: 12/07/2003 [facility] Patient Information 2015 [place], [place]. This information is not intended to replace advice given to you by your health care provider. Make sure you discuss any questions you have with your health care provider.

## 2014-05-03 NOTE — ED Provider Notes (Signed)
CSN: [id number]     Arrival date & time 05/03/14  1512 History   First MD Initiated Contact with Patient 05/03/14 1621     Chief Complaint  Patient presents with  . Generalized Body Aches   (Consider location/radiation/quality/duration/timing/severity/associated sxs/prior Treatment) HPI  History reviewed. No pertinent past medical history. History reviewed. No pertinent past surgical history. History reviewed. No pertinent family history. History  Substance Use Topics  . Smoking status: Current Every Day Smoker -- 0.50 packs/day    Types: Cigarettes  . Smokeless tobacco: Never Used  . Alcohol Use: Yes    Review of Systems  Allergies  Amoxicillin  Home Medications   Prior to Admission medications   Medication Sig Start Date End Date Taking? Authorizing Provider  amitriptyline (ELAVIL) 50 MG tablet Take 1-2 tablets (50-100 mg total) by mouth at bedtime. May increase to 2 pills each night after 10 days 05/03/14   Elenora GammaSamuel L Bradshaw, MD  ibuprofen (ADVIL,MOTRIN) 600 MG tablet Take 1 tablet (600 mg total) by mouth every 6 (six) hours as needed. 01/04/14   Ankit Nanavati, MD   BP 126/73 mmHg  Pulse 57  Temp(Src) 98 F (36.7 C) (Oral)  Resp 16  SpO2 100% Physical Exam  ED Course  Procedures (including critical care time) Labs Review Labs Reviewed - No data to display  Imaging Review No results found.   MDM   1. Neuropathic pain    Pt seen and examined and case discussed with resident, plans as advised, pt agrees.    Linna HoffJames D Draiden Mirsky, MD 05/03/14 [phone]

## 2014-05-03 NOTE — ED Provider Notes (Signed)
CSN: [id number]     Arrival date & time 05/03/14  1512 History   First MD Initiated Contact with Patient 05/03/14 1621     Chief Complaint  Patient presents with  . Generalized Body Aches   (Consider location/radiation/quality/duration/timing/severity/associated sxs/prior Treatment) HPI   24 y/o male here with about 4 weeks of pain in his BL arms and legs. He describes the pain as intermittent shooting pain which affects his arms and legs separately and unpredictably. There are no aggravating or alleviating factors and he denies any injury or trauma. He states that it is electric type numbness and tingling pain affecting sometimes his arms, sometimes legs,and sometime one leg. Generally it lasts several hours at a time. It kept him from sleeping last night and he missed work today. He denies neck and back pain, bowel/bladder dysfunction, saddle anesthesia, and difficulty walking.   History reviewed. No pertinent past medical history. History reviewed. No pertinent past surgical history. History reviewed. No pertinent family history. History  Substance Use Topics  . Smoking status: Current Every Day Smoker -- 0.50 packs/day    Types: Cigarettes  . Smokeless tobacco: Never Used  . Alcohol Use: Yes    Review of Systems   Per HPI No fevers, chills sweats No dyspnea No Chest pain  Allergies  Amoxicillin  Home Medications   Prior to Admission medications   Medication Sig Start Date End Date Taking? Authorizing Provider  amitriptyline (ELAVIL) 50 MG tablet Take 1-2 tablets (50-100 mg total) by mouth at bedtime. May increase to 2 pills each night after 10 days 05/03/14   Elenora GammaSamuel L Daisie Haft, MD  ibuprofen (ADVIL,MOTRIN) 600 MG tablet Take 1 tablet (600 mg total) by mouth every 6 (six) hours as needed. 01/04/14   Ankit Nanavati, MD   BP 126/73 mmHg  Pulse 57  Temp(Src) 98 F (36.7 C) (Oral)  Resp 16  SpO2 100% Physical Exam  Constitutional: He is oriented to person, place, and time.  He appears well-developed and well-nourished. No distress.  HENT:  Head: Normocephalic and atraumatic.  Eyes: Conjunctivae are normal. Right eye exhibits no discharge. Left eye exhibits no discharge.  Neck: Normal range of motion. Neck supple.  Cardiovascular: Normal rate, regular rhythm and normal heart sounds.   No murmur heard. Pulmonary/Chest: Effort normal and breath sounds normal. No respiratory distress.  Musculoskeletal: He exhibits no tenderness.  No tendernes sto palpation of BL Upper and lower extremities or cervical/lumbar spine  Neurological: He is alert and oriented to person, place, and time. He has normal strength. No sensory deficit. He exhibits normal muscle tone. Coordination normal.    ED Course  Procedures (including critical care time) Labs Review Labs Reviewed - No data to display  Imaging Review No results found.   MDM   1. Neuropathic pain    24 y/o male with neuropathic pain without clear etiology. No worrisome finding son exam. Would also be suspicious of restless leg syndrome if he does not have improvement with amitriptyline. Started with treatment for neuropathic pain with amitriptyline and asked that he follow up with his PCP , he has an ppt in 10 days.   Pt seen and discussed with Dr. Artis FlockKindl and he agrees with the plan as discussed above.     Elenora GammaSamuel L Intisar Claudio, MD 05/03/14 [phone]

## 2014-05-03 NOTE — ED Notes (Signed)
Pt  Reports  Symptoms  Of   Generalized  Body  Aches       For  About  1  Month     denys  Any  specefic  Injury          Pt  Also  Reports  A  Callus     On  The  Bottom of  His   Foot  As  Well   He  denys  Any  specefic  Injury

## 2014-05-13 ENCOUNTER — Ambulatory Visit: Payer: BC Managed Care – PPO | Admitting: Family Medicine

## 2014-07-13 ENCOUNTER — Emergency Department (HOSPITAL_COMMUNITY)
Admission: EM | Admit: 2014-07-13 | Discharge: 2014-07-13 | Disposition: A | Discharge status: Home / Self Care | Payer: BLUE CROSS/BLUE SHIELD | Attending: Emergency Medicine | Admitting: Emergency Medicine

## 2014-07-13 ENCOUNTER — Emergency Department (HOSPITAL_COMMUNITY): Payer: BLUE CROSS/BLUE SHIELD

## 2014-07-13 ENCOUNTER — Encounter (HOSPITAL_COMMUNITY): Payer: Self-pay

## 2014-07-13 DIAGNOSIS — Z720 Tobacco use: Secondary | ICD-10-CM | POA: Diagnosis not present

## 2014-07-13 DIAGNOSIS — N200 Calculus of kidney: Secondary | ICD-10-CM | POA: Diagnosis not present

## 2014-07-13 DIAGNOSIS — Z880 Allergy status to penicillin: Secondary | ICD-10-CM | POA: Insufficient documentation

## 2014-07-13 DIAGNOSIS — R109 Unspecified abdominal pain: Secondary | ICD-10-CM | POA: Diagnosis present

## 2014-07-13 DIAGNOSIS — R52 Pain, unspecified: Secondary | ICD-10-CM

## 2014-07-13 DIAGNOSIS — Z79899 Other long term (current) drug therapy: Secondary | ICD-10-CM | POA: Diagnosis not present

## 2014-07-13 LAB — CBC WITH DIFFERENTIAL/PLATELET
Basophils Absolute: 0 10*3/uL (ref 0.0–0.1)
Basophils Relative: 1 % (ref 0–1)
Eosinophils Absolute: 0.1 10*3/uL (ref 0.0–0.7)
Eosinophils Relative: 2 % (ref 0–5)
HCT: 45.1 % (ref 39.0–52.0)
HEMOGLOBIN: 15.6 g/dL (ref 13.0–17.0)
Lymphocytes Relative: 48 % — ABNORMAL HIGH (ref 12–46)
Lymphs Abs: 2.4 10*3/uL (ref 0.7–4.0)
MCH: 32.7 pg (ref 26.0–34.0)
MCHC: 34.6 g/dL (ref 30.0–36.0)
MCV: 94.5 fL (ref 78.0–100.0)
Monocytes Absolute: 0.5 10*3/uL (ref 0.1–1.0)
Monocytes Relative: 10 % (ref 3–12)
NEUTROS PCT: 39 % — AB (ref 43–77)
Neutro Abs: 1.9 10*3/uL (ref 1.7–7.7)
PLATELETS: 219 10*3/uL (ref 150–400)
RBC: 4.77 MIL/uL (ref 4.22–5.81)
RDW: 12.9 % (ref 11.5–15.5)
WBC: 4.9 10*3/uL (ref 4.0–10.5)

## 2014-07-13 LAB — COMPREHENSIVE METABOLIC PANEL
ALBUMIN: 4.1 g/dL (ref 3.5–5.2)
ALT: 18 U/L (ref 0–53)
ANION GAP: 12 (ref 5–15)
AST: 29 U/L (ref 0–37)
Alkaline Phosphatase: 60 U/L (ref 39–117)
BUN: 8 mg/dL (ref 6–23)
CO2: 22 mmol/L (ref 19–32)
CREATININE: 1.4 mg/dL — AB (ref 0.50–1.35)
Calcium: 9.4 mg/dL (ref 8.4–10.5)
Chloride: 104 mmol/L (ref 96–112)
GFR, EST AFRICAN AMERICAN: 81 mL/min — AB (ref >90)
GFR, EST NON AFRICAN AMERICAN: 70 mL/min — AB (ref >90)
GLUCOSE: 109 mg/dL — AB (ref 70–99)
Potassium: 3.9 mmol/L (ref 3.5–5.1)
Sodium: 138 mmol/L (ref 135–145)
Total Bilirubin: 1.1 mg/dL (ref 0.3–1.2)
Total Protein: 6.6 g/dL (ref 6.0–8.3)

## 2014-07-13 LAB — URINE MICROSCOPIC-ADD ON

## 2014-07-13 LAB — URINALYSIS, ROUTINE W REFLEX MICROSCOPIC
Bilirubin Urine: NEGATIVE
GLUCOSE, UA: NEGATIVE mg/dL
Ketones, ur: NEGATIVE mg/dL
LEUKOCYTES UA: NEGATIVE
NITRITE: NEGATIVE
PROTEIN: NEGATIVE mg/dL
Specific Gravity, Urine: 1.008 (ref 1.005–1.030)
Urobilinogen, UA: 0.2 mg/dL (ref 0.0–1.0)
pH: 8 (ref 5.0–8.0)

## 2014-07-13 MED ORDER — TAMSULOSIN HCL 0.4 MG PO CAPS: 0.4000 mg | ORAL_CAPSULE | Freq: Two times a day (BID) | ORAL | Status: DC

## 2014-07-13 MED ORDER — ONDANSETRON 4 MG PO TBDP: ORAL_TABLET | ORAL | Status: DC

## 2014-07-13 MED ORDER — ONDANSETRON HCL 4 MG/2ML IJ SOLN
4.0000 mg | Freq: Once | INTRAMUSCULAR | Status: AC
  Administered 2014-07-13: 4 mg via INTRAVENOUS
  Filled 2014-07-13: qty 2

## 2014-07-13 MED ORDER — OXYCODONE-ACETAMINOPHEN 5-325 MG PO TABS: 2.0000 | ORAL_TABLET | ORAL | Status: DC | PRN

## 2014-07-13 MED ORDER — IBUPROFEN 400 MG PO TABS: 400.0000 mg | ORAL_TABLET | Freq: Four times a day (QID) | ORAL | Status: DC | PRN

## 2014-07-13 NOTE — ED Notes (Signed)
Patient alert and oriented at discharge.  Patient given a strainer and reviewed discharge instructions.  Patient ambulatory at discharge.

## 2014-07-13 NOTE — ED Provider Notes (Signed)
CSN: [id number]     Arrival date & time 07/13/14  1202 History   First MD Initiated Contact with Patient 07/13/14 1226     Chief Complaint  Patient presents with  . Abdominal Pain     (Consider location/radiation/quality/duration/timing/severity/associated sxs/prior Treatment) HPI Jeffrey Bautista is a 24 y.o. male who comes in for evaluation of abdominal discomfort. Patient reports he woke up this morning to use the bathroom, and completely voided and had a bowel movement with the reports sudden onset left-sided flank pain that radiates into his groin. He reports a sensation has been intermittent. He characterizes pain as excruciating. There is been associated nausea and vomiting. Reports he has been with the same sexual partner "for many years now". Denies fevers, testicular pain or swelling, penile pain or discharge. No other aggravating or modifying factors.  History reviewed. No pertinent past medical history. History reviewed. No pertinent past surgical history. History reviewed. No pertinent family history. History  Substance Use Topics  . Smoking status: Current Every Day Smoker -- 0.50 packs/day    Types: Cigarettes  . Smokeless tobacco: Never Used  . Alcohol Use: Yes    Review of Systems A 10 point review of systems was completed and was negative except for pertinent positives and negatives as mentioned in the history of present illness     Allergies  Amoxicillin  Home Medications   Prior to Admission medications   Medication Sig Start Date End Date Taking? Authorizing Provider  acetaminophen (TYLENOL) 325 MG tablet Take 650 mg by mouth every 6 (six) hours as needed (pain).   Yes Historical Provider, MD  amitriptyline (ELAVIL) 50 MG tablet Take 1-2 tablets (50-100 mg total) by mouth at bedtime. May increase to 2 pills each night after 10 days Patient not taking: Reported on 07/13/2014 05/03/14   Elenora Gamma, MD  ibuprofen (ADVIL,MOTRIN) 400 MG tablet Take 1 tablet  (400 mg total) by mouth every 6 (six) hours as needed. 07/13/14   Joycie Peek, PA-C  ondansetron (ZOFRAN ODT) 4 MG disintegrating tablet  ODT q4 hours prn nausea/vomit 07/13/14   Joycie Peek, PA-C  oxyCODONE-acetaminophen (PERCOCET/ROXICET) 5-325 MG per tablet Take 2 tablets by mouth every 4 (four) hours as needed for severe pain. 07/13/14   Joycie Peek, PA-C  tamsulosin (FLOMAX) 0.4 MG CAPS capsule Take 1 capsule (0.4 mg total) by mouth 2 (two) times daily. 07/13/14   Quadir Muns, PA-C   BP 101/44 mmHg  Pulse 56  Temp(Src) 97.7 F (36.5 C) (Oral)  Resp 12  SpO2 99% Physical Exam  Constitutional: He is oriented to person, place, and time. He appears well-developed and well-nourished.  HENT:  Head: Normocephalic and atraumatic.  Mouth/Throat: Oropharynx is clear and moist.  Eyes: Conjunctivae are normal. Pupils are equal, round, and reactive to light. Right eye exhibits no discharge. Left eye exhibits no discharge. No scleral icterus.  Neck: Neck supple.  Cardiovascular: Normal rate, regular rhythm and normal heart sounds.   Pulmonary/Chest: Effort normal and breath sounds normal. No respiratory distress. He has no wheezes. He has no rales.  Abdominal: Soft. There is no tenderness.  No CVA tenderness. Abdomen is otherwise soft, nontender, nondistended without rebound or guarding. No obvious lesions or deformities or other palpable masses.  Musculoskeletal: He exhibits no tenderness.  Neurological: He is alert and oriented to person, place, and time.  Cranial Nerves II-XII grossly intact  Skin: Skin is warm and dry. No rash noted.  Psychiatric: He has a normal mood and  affect.  Nursing note and vitals reviewed.   ED Course  Procedures (including critical care time) Labs Review Labs Reviewed  CBC WITH DIFFERENTIAL/PLATELET - Abnormal; Notable for the following:    Neutrophils Relative % 39 (*)    Lymphocytes Relative 48 (*)    All other components within normal  limits  COMPREHENSIVE METABOLIC PANEL - Abnormal; Notable for the following:    Glucose, Bld 109 (*)    Creatinine, Ser 1.40 (*)    GFR calc non Af Amer 70 (*)    GFR calc Af Amer 81 (*)    All other components within normal limits  URINALYSIS, ROUTINE W REFLEX MICROSCOPIC - Abnormal; Notable for the following:    Hgb urine dipstick SMALL (*)    All other components within normal limits  URINE MICROSCOPIC-ADD ON    Imaging Review Ct Abdomen Pelvis Wo Contrast  07/13/2014   CLINICAL DATA:  Lower abdominal pain after awaking this morning, overwhelming sensation to urinate  EXAM: CT ABDOMEN AND PELVIS WITHOUT CONTRAST  TECHNIQUE: Multidetector CT imaging of the abdomen and pelvis was performed following the standard protocol without IV contrast. Sagittal and coronal MPR images reconstructed from axial data set.  COMPARISON:  03/23/2011  FINDINGS: Lung bases clear.  No definite hydronephrosis identified.  Ureters suboptimally visualized due to lack of adequate fat planes, no gross dilatation seen.  4 mm diameter calculus present at the LEFT ureterovesical junction new since previous exam.  No additional urinary tract calcifications identified.  Bladder decompressed.  Within limits of a nonenhanced exam, no focal abnormalities of the liver, gallbladder, spleen, pancreas, kidneys, or adrenal glands otherwise identified.  Stomach and bowel loops grossly unremarkable for technique.  No mass, adenopathy, free fluid or free air.  Bones unremarkable.  IMPRESSION: 4 mm calculus at LEFT ureterovesical junction new since 2012.  No definite evidence of hydronephrosis or ureteral dilatation identified.   Electronically Signed   By: Ulyses Southward M.D.   On: 07/13/2014 15:53     EKG Interpretation None      Meds given in ED:  Medications  ondansetron [facility]) injection 4 mg (4 mg Intravenous Given 07/13/14 1228)    New Prescriptions   IBUPROFEN (ADVIL,MOTRIN) 400 MG TABLET    Take 1 tablet (400 mg total) by  mouth every 6 (six) hours as needed.   ONDANSETRON (ZOFRAN ODT) 4 MG DISINTEGRATING TABLET     ODT q4 hours prn nausea/vomit   OXYCODONE-ACETAMINOPHEN (PERCOCET/ROXICET) 5-325 MG PER TABLET    Take 2 tablets by mouth every 4 (four) hours as needed for severe pain.   TAMSULOSIN (FLOMAX) 0.4 MG CAPS CAPSULE    Take 1 capsule (0.4 mg total) by mouth 2 (two) times daily.   Filed Vitals:   07/13/14 1215 07/13/14 1456  BP: 118/60 101/44  Pulse: 69 56  Temp: 97.7 F (36.5 C)   TempSrc: Oral   Resp: 14 12  SpO2: 100% 99%     MDM  Vitals stable - WNL -afebrile Pt resting comfortably in ED. denies any discomfort at this time PE--normal abdominal exam. Grossly benign physical exam. Labwork--patient with creatinine of 1.4, labs otherwise noncontributory. Urinalysis shows small hemoglobin, otherwise normal. Imaging--CT renal stone study shows 4.4 mm nonobstructing stone at the left UVJ. No hydronephrosis or hydroureter.  DDX--patient with nephrolithiasis, Will DC with anti-emetics, pain medicines, tamsulosin and instructions to follow-up with urology. Referral given for urology. No evidence of urosepsis or other acute infection.  I discussed all relevant lab findings  and imaging results with pt and they verbalized understanding. Discussed f/u with PCP within 48 hrs and return precautions, pt very amenable to plan.   Final diagnoses:  Nephrolithiasis       Joycie PeekBenjamin Ashden Sonnenberg, PA-C 07/13/14 1632  Mancel BaleElliott Wentz, MD 07/13/14 [phone]

## 2014-07-13 NOTE — ED Notes (Signed)
Pt reports he woke up to use the bathroom this morning and went back to bed.  45 minutes later pt woke up to an excruciating pain in his lower abdomen with an overwhelming sensation to urinate.  Pt reports he was unable to urinate and the pain just continued to intensify.  Pt has had three episodes of emesis.  Pt denies dysuria or flank pain.  Pt denies diarrhea.

## 2014-07-13 NOTE — Discharge Instructions (Signed)
Kidney Stones °Kidney stones (urolithiasis) are deposits that form inside your kidneys. The intense pain is caused by the stone moving through the urinary tract. When the stone moves, the ureter goes into spasm around the stone. The stone is usually passed in the urine.  °CAUSES  °· A disorder that makes certain neck glands produce too much parathyroid hormone (primary hyperparathyroidism). °· A buildup of uric acid crystals, similar to gout in your joints. °· Narrowing (stricture) of the ureter. °· A kidney obstruction present at birth (congenital obstruction). °· Previous surgery on the kidney or ureters. °· Numerous kidney infections. °SYMPTOMS  °· Feeling sick to your stomach (nauseous). °· Throwing up (vomiting). °· Blood in the urine (hematuria). °· Pain that usually spreads (radiates) to the groin. °· Frequency or urgency of urination. °DIAGNOSIS  °· Taking a history and physical exam. °· Blood or urine tests. °· CT scan. °· Occasionally, an examination of the inside of the urinary bladder (cystoscopy) is performed. °TREATMENT  °· Observation. °· Increasing your fluid intake. °· Extracorporeal shock wave lithotripsy--This is a noninvasive procedure that uses shock waves to break up kidney stones. °· Surgery may be needed if you have severe pain or persistent obstruction. There are various surgical procedures. Most of the procedures are performed with the use of small instruments. Only small incisions are needed to accommodate these instruments, so recovery time is minimized. °The size, location, and chemical composition are all important variables that will determine the proper choice of action for you. Talk to your health care provider to better understand your situation so that you will minimize the risk of injury to yourself and your kidney.  °HOME CARE INSTRUCTIONS  °· Drink enough water and fluids to keep your urine clear or pale yellow. This will help you to pass the stone or stone fragments. °· Strain  all urine through the provided strainer. Keep all particulate matter and stones for your health care provider to see. The stone causing the pain may be as small as a grain of salt. It is very important to use the strainer each and every time you pass your urine. The collection of your stone will allow your health care provider to analyze it and verify that a stone has actually passed. The stone analysis will often identify what you can do to reduce the incidence of recurrences. °· Only take over-the-counter or prescription medicines for pain, discomfort, or fever as directed by your health care provider. °· Make a follow-up appointment with your health care provider as directed. °· Get follow-up X-rays if required. The absence of pain does not always mean that the stone has passed. It may have only stopped moving. If the urine remains completely obstructed, it can cause loss of kidney function or even complete destruction of the kidney. It is your responsibility to make sure X-rays and follow-ups are completed. Ultrasounds of the kidney can show blockages and the status of the kidney. Ultrasounds are not associated with any radiation and can be performed easily in a matter of minutes. °SEEK MEDICAL CARE IF: °· You experience pain that is progressive and unresponsive to any pain medicine you have been prescribed. °SEEK IMMEDIATE MEDICAL CARE IF:  °· Pain cannot be controlled with the prescribed medicine. °· You have a fever or shaking chills. °· The severity or intensity of pain increases over 18 hours and is not relieved by pain medicine. °· You develop a new onset of abdominal pain. °· You feel faint or pass out. °·   You are unable to urinate. MAKE SURE YOU:   Understand these instructions.  Will watch your condition.  Will get help right away if you are not doing well or get worse. Document Released: 03/11/2005 Document Revised: 11/11/2012 Document Reviewed: 08/12/2012 [facility] Patient Information 2015  [place], [place]. This information is not intended to replace advice given to you by your health care provider. Make sure you discuss any questions you have with your health care provider.  Please take your medications as prescribed. 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 It is important to follow-up with your primary care and urology for further evaluation management of your symptoms. Return to ED for new or worsening symptoms.

## 2014-07-20 ENCOUNTER — Other Ambulatory Visit: Payer: Self-pay | Admitting: Urology

## 2014-07-24 DIAGNOSIS — N200 Calculus of kidney: Secondary | ICD-10-CM

## 2014-07-24 HISTORY — DX: Calculus of kidney: N20.0

## 2014-07-29 ENCOUNTER — Encounter (HOSPITAL_BASED_OUTPATIENT_CLINIC_OR_DEPARTMENT_OTHER): Payer: Self-pay | Admitting: *Deleted

## 2014-07-29 NOTE — Progress Notes (Signed)
[facility],KUB on arrival-Instructed Npo after Mn-refrain from smoking also.

## 2014-08-01 NOTE — H&P (Signed)
Active Problems Problems  1. Calculus of distal left ureter (N20.1)  History of Present Illness Jeffrey Bautista is seen today for evaluation of distal left ureteral stone at level of UVJ seen on CT 07/13/2014.  He awoke with severe LLQ abdominal pain, n/v, and LUTS. He described the pain as severe and unrelenting. Diagnosis of a distal stone and adequate pain relief was achieved and patient was discharged. He was given Tamsulosin, antiemetic, and pain medication which has kept him symptom free until this morning when his symptoms returned. He endorses LLQ pain with gross hematuria, dysuria, frequency, intermittentency, and weak stream. He also endorses nausea with pain but denies fever. He states never having a kidney stone before or family history of such.   Past Medical History Problems  1. History of No acute medical problems  Surgical History Problems  1. History of No Surgical Problems  Current Meds 1. Ibuprofen TABS;  Therapy: (Recorded:25Apr2016) to Recorded 2. Ondansetron 4 MG Oral Tablet Dispersible;  Therapy: (Recorded:25Apr2016) to Recorded 3. Percocet TABS;  Therapy: (Recorded:25Apr2016) to Recorded  Allergies Medication  1. amoxicillin  Family History Problems  1. No pertinent family history  Social History Problems    Denied: History of Alcohol use   Denied: History of Caffeine use   Current every day smoker (F17.200)   Number of children   no children   Occupation   [employer]   Single  Review of Systems Genitourinary, constitutional, skin, eye, otolaryngeal, hematologic/lymphatic, cardiovascular, pulmonary, endocrine, musculoskeletal, gastrointestinal, neurological and psychiatric system(s) were reviewed and pertinent findings if present are noted and are otherwise negative.  Genitourinary: urinary frequency, dysuria, difficulty starting the urinary stream, weak urinary stream, urinary stream starts and stops, hematuria and initiating urination requires  straining.  Gastrointestinal: nausea and abdominal pain.    Vitals Vital Signs [Data Includes: Last 1 Day]  Recorded: 25Apr2016 01:48PM  Height: 5 ft 8 in Weight: 130 lb  BMI Calculated: 19.77 BSA Calculated: 1.7 Blood Pressure: 110 / 59 Temperature: 98 F Heart Rate: 80  Physical Exam Constitutional: Well nourished and well developed . No acute distress.  ENT:. The ears and nose are normal in appearance.  Neck: The appearance of the neck is normal and no neck mass is present.  Pulmonary: No respiratory distress and normal respiratory rhythm and effort.  Cardiovascular: Heart rate and rhythm are normal . No peripheral edema.  Abdomen: The abdomen is soft and nontender. No masses are palpated. Tenderness in the LLQ is present. No CVA tenderness. No hernias are palpable. No hepatosplenomegaly noted.  Rectal: Rectal exam demonstrates normal sphincter tone, no tenderness and no masses. The prostate has no nodularity and is not tender. The left seminal vesicle is nonpalpable. The right seminal vesicle is nonpalpable. The perineum is normal on inspection.  Genitourinary: Examination of the penis demonstrates no discharge, no masses, no lesions and a normal meatus. The scrotum is without lesions. The right epididymis is palpably normal and non-tender. The left epididymis is palpably normal and non-tender. The right testis is non-tender and without masses. The left testis is non-tender and without masses.  Lymphatics: The femoral and inguinal nodes are not enlarged or tender.  Skin: Normal skin turgor, no visible rash and no visible skin lesions.  Neuro/Psych:. Mood and affect are appropriate.    Results/Data  Urine [Data Includes: Last 1 Day]   25Apr2016  COLOR YELLOW   APPEARANCE CLEAR   SPECIFIC GRAVITY 1.015   pH 7.0   GLUCOSE NEG mg/dL  BILIRUBIN NEG  KETONE NEG mg/dL  BLOOD SMALL   PROTEIN NEG mg/dL  UROBILINOGEN 0.2 mg/dL  NITRITE NEG   LEUKOCYTE ESTERASE NEG   SQUAMOUS  EPITHELIAL/HPF NONE SEEN   WBC 0-2 WBC/hpf  RBC 3-6 RBC/hpf  BACTERIA RARE   CRYSTALS NONE SEEN   CASTS NONE SEEN    The following images/tracing/specimen were independently visualized:  CT films reviewed. KUB today shows a faint 3-44mm LUVJ stone. No other stones seen. No gas pattern, bone or soft tissue abnormalities noted.  The following clinical lab reports were reviewed:  3-6 RBC/HPF seen.   CT imaging reviewed from 07/13/2014  A KUB performed in clinic today to evaluate location of the patient's stone. The renal shadows are clear bilaterally. The gas pattern is normal appearing. The bony structures are without significant abnormality(phlebolith?). The previously seen stone in the left uvj region appears to have not moved. There are no additional stones in the expected trajectory of the right or left ureter nor the renal shadows bilaterally. No Degenerative changes are noted in the lumbar spine wo curvature (scoliosis).     18 Jul 2014 1:42 PM  UA With REFLEX    COLOR YELLOW     APPEARANCE CLEAR     SPECIFIC GRAVITY 1.015     pH 7.0     GLUCOSE NEG     BILIRUBIN NEG     KETONE NEG     BLOOD SMALL     PROTEIN NEG     UROBILINOGEN 0.2     NITRITE NEG     LEUKOCYTE ESTERASE NEG     SQUAMOUS EPITHELIAL/HPF NONE SEEN     WBC 0-2     CRYSTALS NONE SEEN     CASTS NONE SEEN     RBC 3-6     BACTERIA RARE     Assessment Assessed  1. Calculus of distal left ureter (N20.1)  LUVJ stone.   Plan Calculus of distal left ureter  1. Follow-up Schedule Surgery Office  Follow-up  Status: Complete  Done: 25Apr2016 2. KUB; Status:Resulted - Requires Verification;   Done: [id number] 02:37PM  He should be able to pass the stones but I will set him up for possible ureteroscopy in 1-2 weeks should he not be able.   Discussion/Summary With a cessation of symptoms f/u by the acute nature of them today along with KUB results, the patient's stone has not moved but may be in the process of  passing. We discussed management strategies of ureteral stones including medical expulsive therapy (MET) (preferred for stones <5mm diameter), ureteroscopic stone manipulation (URS), and shockwave lithotripsy (SWL) in detail including relative risks / benefits / and efficacy. We discussed that all patients are candidates for MET as long as can keep comfortable and hydrated. For ureteroscopy I described the risks which include heart attack, stroke, pulmonary embolus, death, bleeding, infection, damage to contiguous structures, positioning injury, ureteral stricture, ureteral avulsion, ureteral injury, need for ureteral stent, inability to perform ureteroscopy, need for an interval procedure, inability to clear stone burden, stent discomfort and pain. For observation I described the risks which include but are not limited to silent renal damage, life-threatening infection, need for emergent surgery, failure to pass stone, and pain. The patient understands the importance of interim follow up in regards to fever, uncontrollable pain & N/V, and gross hematuria in the presence of new or worsening urological symptoms. The patient would like to continue managing his stone with analgesia but will have him scheduled for outpatient ureteroscopy next  week if he does not pass the stone in the interim.

## 2014-08-02 ENCOUNTER — Ambulatory Visit (HOSPITAL_BASED_OUTPATIENT_CLINIC_OR_DEPARTMENT_OTHER): Payer: BLUE CROSS/BLUE SHIELD | Admitting: Anesthesiology

## 2014-08-02 ENCOUNTER — Ambulatory Visit (HOSPITAL_COMMUNITY): Payer: BLUE CROSS/BLUE SHIELD

## 2014-08-02 ENCOUNTER — Ambulatory Visit (HOSPITAL_BASED_OUTPATIENT_CLINIC_OR_DEPARTMENT_OTHER)
Admission: RE | Admit: 2014-08-02 | Discharge: 2014-08-02 | Disposition: A | Discharge status: Home / Self Care | Payer: BLUE CROSS/BLUE SHIELD | Source: Ambulatory Visit | Attending: Urology | Admitting: Urology

## 2014-08-02 ENCOUNTER — Encounter (HOSPITAL_BASED_OUTPATIENT_CLINIC_OR_DEPARTMENT_OTHER)
Admission: RE | Disposition: A | Discharge status: Home / Self Care | Payer: Self-pay | Source: Ambulatory Visit | Attending: Urology

## 2014-08-02 ENCOUNTER — Encounter (HOSPITAL_BASED_OUTPATIENT_CLINIC_OR_DEPARTMENT_OTHER): Payer: Self-pay | Admitting: *Deleted

## 2014-08-02 DIAGNOSIS — F17200 Nicotine dependence, unspecified, uncomplicated: Secondary | ICD-10-CM | POA: Diagnosis not present

## 2014-08-02 DIAGNOSIS — Z538 Procedure and treatment not carried out for other reasons: Secondary | ICD-10-CM | POA: Diagnosis not present

## 2014-08-02 DIAGNOSIS — Z79891 Long term (current) use of opiate analgesic: Secondary | ICD-10-CM | POA: Insufficient documentation

## 2014-08-02 DIAGNOSIS — Z79899 Other long term (current) drug therapy: Secondary | ICD-10-CM | POA: Insufficient documentation

## 2014-08-02 DIAGNOSIS — N201 Calculus of ureter: Secondary | ICD-10-CM | POA: Diagnosis not present

## 2014-08-02 DIAGNOSIS — Z791 Long term (current) use of non-steroidal anti-inflammatories (NSAID): Secondary | ICD-10-CM | POA: Diagnosis not present

## 2014-08-02 HISTORY — DX: Calculus of kidney: N20.0

## 2014-08-02 SURGERY — CYSTOSCOPY, WITH CALCULUS REMOVAL USING BASKET
Anesthesia: General

## 2014-08-02 MED ORDER — CIPROFLOXACIN IN D5W 400 MG/200ML IV SOLN
400.0000 mg | INTRAVENOUS | Status: DC
  Filled 2014-08-02: qty 200

## 2014-08-02 MED ORDER — MIDAZOLAM HCL 2 MG/2ML IJ SOLN
INTRAMUSCULAR | Status: AC
  Filled 2014-08-02: qty 2

## 2014-08-02 MED ORDER — FENTANYL CITRATE (PF) 100 MCG/2ML IJ SOLN
INTRAMUSCULAR | Status: AC
  Filled 2014-08-02: qty 6

## 2014-08-02 MED ORDER — LACTATED RINGERS IV SOLN
INTRAVENOUS | Status: DC
  Filled 2014-08-02: qty 1000

## 2014-08-02 MED ORDER — CIPROFLOXACIN IN D5W 400 MG/200ML IV SOLN
INTRAVENOUS | Status: AC
  Filled 2014-08-02: qty 200

## 2014-08-02 SURGICAL SUPPLY — 32 items
BAG DRAIN URO-CYSTO SKYTR STRL (DRAIN) ×1 IMPLANT
BAG DRN UROCATH (DRAIN)
BASKET LASER NITINOL 1.9FR (BASKET) IMPLANT
BASKET ZERO TIP NITINOL 2.4FR (BASKET) IMPLANT
BSKT STON RTRVL 120 1.9FR (BASKET)
BSKT STON RTRVL ZERO TP 2.4FR (BASKET)
CANISTER SUCT LVC 12 LTR MEDI- (MISCELLANEOUS) IMPLANT
CATH URET 5FR 28IN CONE TIP (BALLOONS)
CATH URET 5FR 28IN OPEN ENDED (CATHETERS) IMPLANT
CATH URET 5FR 70CM CONE TIP (BALLOONS) IMPLANT
CLOTH BEACON ORANGE TIMEOUT ST (SAFETY) ×1 IMPLANT
ELECT REM PT RETURN 9FT ADLT (ELECTROSURGICAL)
ELECTRODE REM PT RTRN 9FT ADLT (ELECTROSURGICAL) IMPLANT
FIBER LASER FLEXIVA 1000 (UROLOGICAL SUPPLIES) IMPLANT
FIBER LASER FLEXIVA 365 (UROLOGICAL SUPPLIES) IMPLANT
FIBER LASER FLEXIVA 550 (UROLOGICAL SUPPLIES) IMPLANT
FIBER LASER TRAC TIP (UROLOGICAL SUPPLIES) IMPLANT
GLOVE SURG SS PI 8.0 STRL IVOR (GLOVE) ×1 IMPLANT
GOWN STRL REUS W/ TWL LRG LVL3 (GOWN DISPOSABLE) IMPLANT
GOWN STRL REUS W/ TWL XL LVL3 (GOWN DISPOSABLE) ×1 IMPLANT
GOWN STRL REUS W/TWL LRG LVL3 (GOWN DISPOSABLE)
GOWN STRL REUS W/TWL XL LVL3 (GOWN DISPOSABLE)
GUIDEWIRE 0.038 PTFE COATED (WIRE) IMPLANT
GUIDEWIRE ANG ZIPWIRE 038X150 (WIRE) IMPLANT
GUIDEWIRE STR DUAL SENSOR (WIRE) ×1 IMPLANT
IV NS IRRIG 3000ML ARTHROMATIC (IV SOLUTION) ×1 IMPLANT
KIT BALLIN UROMAX 15FX10 (LABEL) IMPLANT
KIT BALLN UROMAX 15FX4 (MISCELLANEOUS) IMPLANT
KIT BALLN UROMAX 26 75X4 (MISCELLANEOUS)
PACK CYSTO (CUSTOM PROCEDURE TRAY) ×1 IMPLANT
SET HIGH PRES BAL DIL (LABEL)
SHEATH ACCESS URETERAL 38CM (SHEATH) IMPLANT

## 2014-08-02 NOTE — Anesthesia Preprocedure Evaluation (Addendum)
Anesthesia Evaluation  Patient identified by MRN, date of birth, ID band Patient awake    Reviewed: Allergy & Precautions, NPO status , Patient's Chart, lab work & pertinent test results  Airway Mallampati: II  TM Distance: >3 FB Neck ROM: Full    Dental no notable dental hx.    Pulmonary neg pulmonary ROS, Current Smoker,  breath sounds clear to auscultation  Pulmonary exam normal       Cardiovascular negative cardio ROS Normal cardiovascular examRhythm:Regular Rate:Normal     Neuro/Psych negative neurological ROS  negative psych ROS   GI/Hepatic negative GI ROS, Neg liver ROS,   Endo/Other  negative endocrine ROS  Renal/GU negative Renal ROS  negative genitourinary   Musculoskeletal negative musculoskeletal ROS (+)   Abdominal   Peds negative pediatric ROS (+)  Hematology negative hematology ROS (+)   Anesthesia Other Findings   Reproductive/Obstetrics negative OB ROS                            Anesthesia Physical Anesthesia Plan  ASA: II  Anesthesia Plan: General   Post-op Pain Management:    Induction: Intravenous  Airway Management Planned: LMA  Additional Equipment:   Intra-op Plan:   Post-operative Plan: Extubation in OR  Informed Consent: I have reviewed the patients History and Physical, chart, labs and discussed the procedure including the risks, benefits and alternatives for the proposed anesthesia with the patient or authorized representative who has indicated his/her understanding and acceptance.   Dental advisory given  Plan Discussed with: CRNA  Anesthesia Plan Comments:         Anesthesia Quick Evaluation

## 2014-08-02 NOTE — Interval H&P Note (Signed)
History and Physical Interval Note:  He has had no recent symptoms and think he has passed his stone.   KUB today shows no evidence of the stone.   The case will be cancelled.   08/02/2014 7:21 AM  Jeffrey ChardJohn S Bautista  has presented today for surgery, with the diagnosis of LEFT UV J STONE  The various methods of treatment have been discussed with the patient and family. After consideration of risks, benefits and other options for treatment, the patient has consented to  Procedure(s): CYSTOSCOPY/LEFT URETEROSCOPY/HOLMIUM LASER/STONE EXTRACTION POSSIBLE STENT PLACEMENT (Left) as a surgical intervention .  The patient's history has been reviewed, patient examined, no change in status, stable for surgery.  I have reviewed the patient's chart and labs.  Questions were answered to the patient's satisfaction.     Bryer Cozzolino,Logon J

## 2014-08-02 NOTE — OR Nursing (Signed)
Patient denies having any discomfort in the last few days. Dr. Annabell HowellsWrenn made aware and here to assess. No visible stone. Case cancelled.

## 2014-09-08 ENCOUNTER — Emergency Department (HOSPITAL_COMMUNITY)
Admission: EM | Admit: 2014-09-08 | Discharge: 2014-09-08 | Disposition: A | Discharge status: Home / Self Care | Payer: BLUE CROSS/BLUE SHIELD | Attending: Emergency Medicine | Admitting: Emergency Medicine

## 2014-09-08 ENCOUNTER — Encounter (HOSPITAL_COMMUNITY): Payer: Self-pay | Admitting: *Deleted

## 2014-09-08 ENCOUNTER — Emergency Department (HOSPITAL_COMMUNITY): Payer: BLUE CROSS/BLUE SHIELD

## 2014-09-08 DIAGNOSIS — S299XXA Unspecified injury of thorax, initial encounter: Secondary | ICD-10-CM | POA: Insufficient documentation

## 2014-09-08 DIAGNOSIS — Y92410 Unspecified street and highway as the place of occurrence of the external cause: Secondary | ICD-10-CM | POA: Insufficient documentation

## 2014-09-08 DIAGNOSIS — Y9389 Activity, other specified: Secondary | ICD-10-CM | POA: Diagnosis not present

## 2014-09-08 DIAGNOSIS — S8991XA Unspecified injury of right lower leg, initial encounter: Secondary | ICD-10-CM | POA: Insufficient documentation

## 2014-09-08 DIAGNOSIS — Z87442 Personal history of urinary calculi: Secondary | ICD-10-CM | POA: Diagnosis not present

## 2014-09-08 DIAGNOSIS — Y998 Other external cause status: Secondary | ICD-10-CM | POA: Diagnosis not present

## 2014-09-08 DIAGNOSIS — Z720 Tobacco use: Secondary | ICD-10-CM | POA: Diagnosis not present

## 2014-09-08 DIAGNOSIS — Z79899 Other long term (current) drug therapy: Secondary | ICD-10-CM | POA: Diagnosis not present

## 2014-09-08 DIAGNOSIS — Z880 Allergy status to penicillin: Secondary | ICD-10-CM | POA: Diagnosis not present

## 2014-09-08 DIAGNOSIS — S4991XA Unspecified injury of right shoulder and upper arm, initial encounter: Secondary | ICD-10-CM | POA: Diagnosis not present

## 2014-09-08 MED ORDER — IBUPROFEN 200 MG PO TABS
600.0000 mg | ORAL_TABLET | Freq: Once | ORAL | Status: AC
  Administered 2014-09-08: 600 mg via ORAL
  Filled 2014-09-08 (×2): qty 1

## 2014-09-08 NOTE — ED Notes (Signed)
Patient transported to X-ray 
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
## 2014-11-02 ENCOUNTER — Encounter (HOSPITAL_COMMUNITY): Payer: Self-pay | Admitting: Emergency Medicine

## 2014-11-02 ENCOUNTER — Emergency Department (HOSPITAL_COMMUNITY): Payer: BLUE CROSS/BLUE SHIELD

## 2014-11-02 ENCOUNTER — Emergency Department (HOSPITAL_COMMUNITY)
Admission: EM | Admit: 2014-11-02 | Discharge: 2014-11-02 | Disposition: A | Discharge status: Home / Self Care | Payer: BLUE CROSS/BLUE SHIELD | Attending: Emergency Medicine | Admitting: Emergency Medicine

## 2014-11-02 DIAGNOSIS — S39011A Strain of muscle, fascia and tendon of abdomen, initial encounter: Secondary | ICD-10-CM | POA: Diagnosis not present

## 2014-11-02 DIAGNOSIS — R109 Unspecified abdominal pain: Secondary | ICD-10-CM | POA: Diagnosis present

## 2014-11-02 DIAGNOSIS — Z7951 Long term (current) use of inhaled steroids: Secondary | ICD-10-CM | POA: Insufficient documentation

## 2014-11-02 DIAGNOSIS — Y998 Other external cause status: Secondary | ICD-10-CM | POA: Insufficient documentation

## 2014-11-02 DIAGNOSIS — X58XXXA Exposure to other specified factors, initial encounter: Secondary | ICD-10-CM | POA: Insufficient documentation

## 2014-11-02 DIAGNOSIS — Y9389 Activity, other specified: Secondary | ICD-10-CM | POA: Diagnosis not present

## 2014-11-02 DIAGNOSIS — Z87442 Personal history of urinary calculi: Secondary | ICD-10-CM | POA: Diagnosis not present

## 2014-11-02 DIAGNOSIS — Z720 Tobacco use: Secondary | ICD-10-CM | POA: Diagnosis not present

## 2014-11-02 DIAGNOSIS — Y9289 Other specified places as the place of occurrence of the external cause: Secondary | ICD-10-CM | POA: Diagnosis not present

## 2014-11-02 DIAGNOSIS — R1011 Right upper quadrant pain: Secondary | ICD-10-CM

## 2014-11-02 DIAGNOSIS — Z880 Allergy status to penicillin: Secondary | ICD-10-CM | POA: Insufficient documentation

## 2014-11-02 LAB — URINE MICROSCOPIC-ADD ON

## 2014-11-02 LAB — CBC WITH DIFFERENTIAL/PLATELET
Basophils Absolute: 0.1 10*3/uL (ref 0.0–0.1)
Basophils Relative: 1 % (ref 0–1)
Eosinophils Absolute: 0.2 10*3/uL (ref 0.0–0.7)
Eosinophils Relative: 4 % (ref 0–5)
HCT: 44.7 % (ref 39.0–52.0)
Hemoglobin: 15.4 g/dL (ref 13.0–17.0)
LYMPHS ABS: 1.6 10*3/uL (ref 0.7–4.0)
Lymphocytes Relative: 39 % (ref 12–46)
MCH: 33.6 pg (ref 26.0–34.0)
MCHC: 34.5 g/dL (ref 30.0–36.0)
MCV: 97.6 fL (ref 78.0–100.0)
MONO ABS: 0.5 10*3/uL (ref 0.1–1.0)
MONOS PCT: 12 % (ref 3–12)
Neutro Abs: 1.8 10*3/uL (ref 1.7–7.7)
Neutrophils Relative %: 44 % (ref 43–77)
Platelets: 210 10*3/uL (ref 150–400)
RBC: 4.58 MIL/uL (ref 4.22–5.81)
RDW: 13.1 % (ref 11.5–15.5)
WBC: 4.1 10*3/uL (ref 4.0–10.5)

## 2014-11-02 LAB — COMPREHENSIVE METABOLIC PANEL
ALBUMIN: 4.2 g/dL (ref 3.5–5.0)
ALT: 17 U/L (ref 17–63)
AST: 32 U/L (ref 15–41)
Alkaline Phosphatase: 57 U/L (ref 38–126)
Anion gap: 9 (ref 5–15)
BUN: 6 mg/dL (ref 6–20)
CALCIUM: 9.5 mg/dL (ref 8.9–10.3)
CO2: 26 mmol/L (ref 22–32)
CREATININE: 1.1 mg/dL (ref 0.61–1.24)
Chloride: 105 mmol/L (ref 101–111)
GFR calc Af Amer: >60 mL/min (ref >60)
GFR calc non Af Amer: >60 mL/min (ref >60)
Glucose, Bld: 91 mg/dL (ref 65–99)
Potassium: 4.9 mmol/L (ref 3.5–5.1)
SODIUM: 140 mmol/L (ref 135–145)
Total Bilirubin: 1.1 mg/dL (ref 0.3–1.2)
Total Protein: 6.5 g/dL (ref 6.5–8.1)

## 2014-11-02 LAB — URINALYSIS, ROUTINE W REFLEX MICROSCOPIC
BILIRUBIN URINE: NEGATIVE
Glucose, UA: NEGATIVE mg/dL
HGB URINE DIPSTICK: NEGATIVE
KETONES UR: NEGATIVE mg/dL
NITRITE: NEGATIVE
PROTEIN: NEGATIVE mg/dL
SPECIFIC GRAVITY, URINE: 1.02 (ref 1.005–1.030)
UROBILINOGEN UA: 1 mg/dL (ref 0.0–1.0)
pH: 8.5 — ABNORMAL HIGH (ref 5.0–8.0)

## 2014-11-02 LAB — LIPASE, BLOOD: Lipase: 39 U/L (ref 22–51)

## 2014-11-02 MED ORDER — CYCLOBENZAPRINE HCL 10 MG PO TABS: 10.0000 mg | ORAL_TABLET | Freq: Three times a day (TID) | ORAL | Status: DC | PRN

## 2014-11-02 MED ORDER — OXYCODONE-ACETAMINOPHEN 5-325 MG PO TABS: 1.0000 | ORAL_TABLET | ORAL | Status: DC | PRN

## 2014-11-02 MED ORDER — IBUPROFEN 800 MG PO TABS
800.0000 mg | ORAL_TABLET | Freq: Once | ORAL | Status: AC
  Administered 2014-11-02: 800 mg via ORAL
  Filled 2014-11-02: qty 1

## 2014-11-02 NOTE — ED Notes (Signed)
Pt ambulated to the restroom with ease. 

## 2014-11-02 NOTE — Discharge Instructions (Signed)

## 2014-11-02 NOTE — ED Notes (Signed)
MD at bedside. 

## 2014-11-02 NOTE — ED Notes (Signed)
Pt. Sated, I started having right side pain yesterday no other symptoms

## 2014-11-02 NOTE — ED Provider Notes (Signed)
CSN: [id number]     Arrival date & time 11/02/14  0906 History   First MD Initiated Contact with Patient 11/02/14 [phone]     Chief Complaint  Patient presents with  . Abdominal Pain    right lateral pain   HPI  Jeffrey Bautista is a 24yo male presenting today with right sided abdominal pain since yesterday. History of kidney stones noted. States pain is in RUQ and shoots around right flank. Pain feels similar to previous kidney stone, but notes that was on the left side and associated with dysuria. Does note increased frequency and urgency, stating he feels like he has to urinate but can't get anything out sometimes. Denies fevers, nausea, vomiting, diarrhea, constipation, or changes in appetite. Has not tried any pain medication. Has been using heating pads, which helped initially. States for last kidney stone, Ibuprofen  helped significantly. Also had percocet prescribed for last kidney stone, but would prefer trying Ibuprofen first. No further concerns at this time.  Past Medical History  Diagnosis Date  . Nephrolithiasis 07/2014    left   History reviewed. No pertinent past surgical history. No family history on file. Social History  Substance Use Topics  . Smoking status: Current Every Day Smoker -- 0.50 packs/day    Types: Cigarettes  . Smokeless tobacco: Never Used  . Alcohol Use: Yes     Comment: occa    Review of Systems  Constitutional: Negative for fever.  Gastrointestinal: Positive for abdominal pain. Negative for nausea, vomiting, diarrhea and constipation.  Genitourinary: Positive for urgency, frequency and difficulty urinating. Negative for dysuria.      Allergies  Amoxicillin  Home Medications   Prior to Admission medications   Medication Sig Start Date End Date Taking? Authorizing Provider  fluticasone (FLONASE) 50 MCG/ACT nasal spray Place 2 sprays into both nostrils daily.   Yes Historical Provider, MD  ibuprofen (ADVIL,MOTRIN) 400 MG tablet Take 400 mg by  mouth every 6 (six) hours as needed for mild pain.   Yes Historical Provider, MD  oxyCODONE-acetaminophen (PERCOCET/ROXICET) 5-325 MG per tablet Take 1-2 tablets by mouth every 4 (four) hours as needed for severe pain.   Yes Historical Provider, MD   BP 111/70 mmHg  Pulse 58  Temp(Src) 97.8 F (36.6 C)  Resp 16  Ht  (1.727 m)  Wt 130 lb (58.968 kg)  BMI 19.77 kg/m2  SpO2 99% Physical Exam  Constitutional: He appears well-developed and well-nourished. No distress.  HENT:  Head: Normocephalic and atraumatic.  Cardiovascular: Normal rate and regular rhythm.  Exam reveals no gallop and no friction rub.   No murmur heard. Pulmonary/Chest: Effort normal. No respiratory distress. He has no wheezes. He has no rales.  Abdominal: Soft. He exhibits no distension.  Pain with palpation of RUQ. Tenderness over right flank. Positive Murphy's. No tenderness noted over McBurney's.   Musculoskeletal: He exhibits no edema.  Skin: Skin is warm and dry. No rash noted.  Psychiatric: He has a normal mood and affect. His behavior is normal. Judgment and thought content normal.    ED Course  Procedures (including critical care time) Labs Review Labs Reviewed  URINALYSIS, ROUTINE W REFLEX MICROSCOPIC (NOT AT [facility])    Imaging Review No results found.   EKG Interpretation None      MDM   Final diagnoses:  None  Will obtain urinalysis, CMP, CBC with diff, and lipase. Ibuprofen  given for pain. CT renal stone study. CBC, CMP, Lipase normal. Urinalysis with turbid urine  with few bacteria, calcium oxalate crystals, small leukocytes, and increased pH of 8.5. CT renal stone study with bilateral nonobstructive nephrolithiasis, not thought to be source of pain. Pain is most likely musculoskeletal in nature. Stable for discharge. Follow up with PCP. Instructed to take Ibuprofen for pain.     [address] [place], [place] 11/11/14 1241  Nelva Nay, MD 12/23/14 [phone]

## 2014-12-18 ENCOUNTER — Emergency Department (HOSPITAL_COMMUNITY)
Admission: EM | Admit: 2014-12-18 | Discharge: 2014-12-18 | Disposition: A | Discharge status: Home / Self Care | Payer: BLUE CROSS/BLUE SHIELD | Attending: Emergency Medicine | Admitting: Emergency Medicine

## 2014-12-18 ENCOUNTER — Encounter (HOSPITAL_COMMUNITY): Payer: Self-pay | Admitting: Emergency Medicine

## 2014-12-18 ENCOUNTER — Emergency Department (HOSPITAL_COMMUNITY): Payer: BC Managed Care – PPO

## 2014-12-18 DIAGNOSIS — R197 Diarrhea, unspecified: Secondary | ICD-10-CM | POA: Insufficient documentation

## 2014-12-18 DIAGNOSIS — R109 Unspecified abdominal pain: Secondary | ICD-10-CM

## 2014-12-18 DIAGNOSIS — Z720 Tobacco use: Secondary | ICD-10-CM | POA: Insufficient documentation

## 2014-12-18 DIAGNOSIS — R1032 Left lower quadrant pain: Secondary | ICD-10-CM | POA: Insufficient documentation

## 2014-12-18 DIAGNOSIS — Z87442 Personal history of urinary calculi: Secondary | ICD-10-CM | POA: Insufficient documentation

## 2014-12-18 DIAGNOSIS — R1110 Vomiting, unspecified: Secondary | ICD-10-CM

## 2014-12-18 DIAGNOSIS — Z880 Allergy status to penicillin: Secondary | ICD-10-CM | POA: Insufficient documentation

## 2014-12-18 LAB — COMPREHENSIVE METABOLIC PANEL
ALK PHOS: 65 U/L (ref 38–126)
ALT: 29 U/L (ref 17–63)
ANION GAP: 11 (ref 5–15)
AST: 42 U/L — ABNORMAL HIGH (ref 15–41)
Albumin: 4.6 g/dL (ref 3.5–5.0)
BUN: 8 mg/dL (ref 6–20)
CHLORIDE: 108 mmol/L (ref 101–111)
CO2: 22 mmol/L (ref 22–32)
CREATININE: 1.03 mg/dL (ref 0.61–1.24)
Calcium: 9.6 mg/dL (ref 8.9–10.3)
GFR calc non Af Amer: >60 mL/min (ref >60)
Glucose, Bld: 107 mg/dL — ABNORMAL HIGH (ref 65–99)
Potassium: 4 mmol/L (ref 3.5–5.1)
SODIUM: 141 mmol/L (ref 135–145)
Total Bilirubin: 0.7 mg/dL (ref 0.3–1.2)
Total Protein: 7.7 g/dL (ref 6.5–8.1)

## 2014-12-18 LAB — URINE MICROSCOPIC-ADD ON

## 2014-12-18 LAB — URINALYSIS, ROUTINE W REFLEX MICROSCOPIC
Bilirubin Urine: NEGATIVE
Glucose, UA: NEGATIVE mg/dL
Hgb urine dipstick: NEGATIVE
Ketones, ur: NEGATIVE mg/dL
LEUKOCYTES UA: NEGATIVE
Nitrite: NEGATIVE
PROTEIN: 30 mg/dL — AB
SPECIFIC GRAVITY, URINE: 1.016 (ref 1.005–1.030)
UROBILINOGEN UA: 0.2 mg/dL (ref 0.0–1.0)
pH: 8.5 — ABNORMAL HIGH (ref 5.0–8.0)

## 2014-12-18 LAB — CBC
HCT: 44.4 % (ref 39.0–52.0)
HEMOGLOBIN: 15.6 g/dL (ref 13.0–17.0)
MCH: 33.5 pg (ref 26.0–34.0)
MCHC: 35.1 g/dL (ref 30.0–36.0)
MCV: 95.5 fL (ref 78.0–100.0)
Platelets: 180 10*3/uL (ref 150–400)
RBC: 4.65 MIL/uL (ref 4.22–5.81)
RDW: 13 % (ref 11.5–15.5)
WBC: 13.7 10*3/uL — ABNORMAL HIGH (ref 4.0–10.5)

## 2014-12-18 LAB — LIPASE, BLOOD: Lipase: 33 U/L (ref 22–51)

## 2014-12-18 MED ORDER — PROMETHAZINE HCL 25 MG RE SUPP
25.0000 mg | Freq: Four times a day (QID) | RECTAL | Status: DC | PRN
Start: 2014-12-18 — End: 2015-03-01

## 2014-12-18 MED ORDER — ONDANSETRON HCL 4 MG PO TABS: 4.0000 mg | ORAL_TABLET | Freq: Four times a day (QID) | ORAL | Status: DC

## 2014-12-18 MED ORDER — PROMETHAZINE HCL 25 MG/ML IJ SOLN
25.0000 mg | Freq: Once | INTRAMUSCULAR | Status: AC
  Administered 2014-12-18: 25 mg via INTRAVENOUS
  Filled 2014-12-18: qty 1

## 2014-12-18 MED ORDER — SODIUM CHLORIDE 0.9 % IV BOLUS (SEPSIS)
1000.0000 mL | Freq: Once | INTRAVENOUS | Status: AC
  Administered 2014-12-18: 1000 mL via INTRAVENOUS

## 2014-12-18 MED ORDER — PROMETHAZINE HCL 25 MG/ML IJ SOLN: 25.0000 mg | Freq: Once | INTRAMUSCULAR | Status: DC

## 2014-12-18 MED ORDER — METOCLOPRAMIDE HCL 5 MG/ML IJ SOLN
10.0000 mg | Freq: Once | INTRAMUSCULAR | Status: AC
  Administered 2014-12-18: 10 mg via INTRAVENOUS
  Filled 2014-12-18: qty 2

## 2014-12-18 MED ORDER — MORPHINE SULFATE (PF) 4 MG/ML IV SOLN
4.0000 mg | Freq: Once | INTRAVENOUS | Status: AC
  Administered 2014-12-18: 4 mg via INTRAVENOUS
  Filled 2014-12-18: qty 1

## 2014-12-18 NOTE — ED Notes (Signed)
Pt declined fluid challenge at this time for fear of further vomiting

## 2014-12-18 NOTE — ED Notes (Addendum)
24 yom recently tx for kidney stone presents with 3-4 hr onset left lower abd pain with nausea, vomiting x 5-6, loose stool and chills. Continues to vomit after zofran  IV x 2 and 500 ml NS bolus from EMS.   Continues to vomit during triage 2 bilious episodes. Labs drawn.  Assessment documented in flowsheets  10:24 AM : MD aware of pt.

## 2014-12-18 NOTE — ED Notes (Signed)
Bed: WA21 Expected date: 12/18/14 Expected time: 9:47 AM Means of arrival: Ambulance Comments: N/V/D times 3 hours

## 2014-12-18 NOTE — ED Notes (Signed)
Patient given sprite.

## 2014-12-18 NOTE — ED Provider Notes (Signed)
CSN: [id number]     Arrival date & time 12/18/14  7829 History   First MD Initiated Contact with Patient 12/18/14 1017     Chief Complaint  Patient presents with  . Abdominal Pain    24 yom recently tx for kidney stone presents with 3-4 hr onset left lower abd pain with nausea, vomiting x 5-6, loose stool and chills. Continues to vomit after zofran  IV x 2 and 500 ml NS bolus from EMS.   . Emesis  . Diarrhea     (Consider location/radiation/quality/duration/timing/severity/associated sxs/prior Treatment) HPI  Pt presenting with c/o left lower abdominal pain and vomiting.  He states the vomiting began approx 5 hours ago.  Pain is sharp and located in left lower abdomen.  He states the pain is worse than he has had in the past.  The pain came on suddenly.  No difficulty urinating or dysuria.  Emesis is nonbloody and nonbilious.  No fever/chills.  He received IV fluids via ems as well as 2 doses of zofran without much relief in his vomiting.  There are no other associated systemic symptoms, there are no other alleviating or modifying factors.   Past Medical History  Diagnosis Date  . Nephrolithiasis 07/2014    left   History reviewed. No pertinent past surgical history. History reviewed. No pertinent family history. Social History  Substance Use Topics  . Smoking status: Current Every Day Smoker -- 0.50 packs/day    Types: Cigarettes  . Smokeless tobacco: Never Used  . Alcohol Use: Yes     Comment: occa    Review of Systems  ROS reviewed and all otherwise negative except for mentioned in HPI    Allergies  Amoxicillin  Home Medications   Prior to Admission medications   Medication Sig Start Date End Date Taking? Authorizing Provider  cyclobenzaprine (FLEXERIL) 10 MG tablet Take 1 tablet (10 mg total) by mouth 3 (three) times daily as needed for muscle spasms. Patient not taking: Reported on 12/18/2014 11/02/14   Nelva Nay, MD  ondansetron (ZOFRAN) 4 MG tablet Take 1  tablet (4 mg total) by mouth every 6 (six) hours. 12/18/14   Jerelyn Scott, MD  oxyCODONE-acetaminophen (PERCOCET/ROXICET) 5-325 MG per tablet Take 1-2 tablets by mouth every 4 (four) hours as needed for severe pain. Patient not taking: Reported on 12/18/2014 11/02/14   Nelva Nay, MD  promethazine (PHENERGAN) 25 MG suppository Place 1 suppository (25 mg total) rectally every 6 (six) hours as needed for nausea or vomiting. 12/18/14   Jerelyn Scott, MD   BP 113/62 mmHg  Pulse 89  Temp(Src) 97.4 F (36.3 C) (Oral)  Resp 18  SpO2 99%  Vitals reviewed Physical Exam  Physical Examination: General appearance - alert, uncomfortable appearing, and in no distress, actively vomiting Mental status - alert, oriented to person, place, and time Eyes - no conjunctival injection, no scleral icterus Mouth - mucous membranes moist, pharynx normal without lesions Chest - clear to auscultation, no wheezes, rales or rhonchi, symmetric air entry Heart - normal rate, regular rhythm, normal S1, S2, no murmurs, rubs, clicks or gallops Abdomen - soft, ttp in left lower abdomen, no gaurding or rebound, nabs, nondistended, no masses or organomegaly Neurological - alert, oriented, normal speech Extremities - peripheral pulses normal, no pedal edema, no clubbing or cyanosis Skin - normal coloration and turgor, no rashes  ED Course  Procedures (including critical care time) Labs Review Labs Reviewed  CBC - Abnormal; Notable for the following:  WBC 13.7 (*)    All other components within normal limits  COMPREHENSIVE METABOLIC PANEL - Abnormal; Notable for the following:    Glucose, Bld 107 (*)    AST 42 (*)    All other components within normal limits  URINALYSIS, ROUTINE W REFLEX MICROSCOPIC (NOT AT [facility]) - Abnormal; Notable for the following:    APPearance CLOUDY (*)    pH 8.5 (*)    Protein, ur 30 (*)    All other components within normal limits  LIPASE, BLOOD  URINE MICROSCOPIC-ADD ON    Imaging  Review Ct Renal Stone Study  12/18/2014   CLINICAL DATA:  Patient recently treated for renal stone now with 3-4 hours left lower abdominal pain, nausea and vomiting.  EXAM: CT ABDOMEN AND PELVIS WITHOUT CONTRAST  TECHNIQUE: Multidetector CT imaging of the abdomen and pelvis was performed following the standard protocol without IV contrast.  COMPARISON:  CT abdomen pelvis 11/02/2014  FINDINGS: Lower chest:  No consolidative or nodular pulmonary opacities.  Hepatobiliary: Liver is normal in size and contour. Gallbladder is unremarkable.  Pancreas: Unremarkable  Spleen:  Unremarkable  Adrenals/Urinary Tract: There is a 2 mm nonobstructing stone within the inferior pole of the right kidney (image 37; series 3). There is a 3 mm nonobstructing stone superior pole left kidney. No ureterolithiasis. Urinary bladder is unremarkable.  Stomach/Bowel: No evidence for bowel obstruction. No significant bowel wall thickening. Normal appendix.  Vascular/Lymphatic: Normal caliber abdominal aorta. No retroperitoneal lymphadenopathy.  Other: None  Musculoskeletal: No aggressive or acute appearing osseous lesions.  IMPRESSION: Bilateral nonobstructing nephrolithiasis.  No hydronephrosis or hydroureter.  No definite ureterolithiasis.   Electronically Signed   By: Annia Belt M.D.   On: 12/18/2014 12:11   I have personally reviewed and evaluated these images and lab results as part of my medical decision-making.   EKG Interpretation None      MDM   Final diagnoses:  Abdominal pain  Vomiting and diarrhea    Pt presenting with c/o of abdominal pain with vomiting.  He is actively vomiting on arrival after zofran per EMS.  Pt sleeping and appears improved after phenergan, when awakened he began to have more vomiting.  Given reglan.  CT obtained and shows no evidence of obstructing stones- no other acute findings to suggest appendicitis, choleycystitis or other acute emergent process. .  Pt has been able to tolerate po  challenge in the ED.  Discharged with nausea med rxs.  Discharged with strict return precautions.  Pt agreeable with plan.    Jerelyn Scott, MD 12/18/14 [phone]

## 2014-12-18 NOTE — Discharge Instructions (Signed)
Return to the ED with any concerns including vomiting and not able to keep down liquids, worsening abdominal pain, fainting, decreased level of alertness/lethargy, or any other alarming symptoms °

## 2015-03-01 ENCOUNTER — Encounter (HOSPITAL_COMMUNITY): Payer: Self-pay | Admitting: *Deleted

## 2015-03-01 ENCOUNTER — Emergency Department (HOSPITAL_COMMUNITY): Payer: BC Managed Care – PPO

## 2015-03-01 ENCOUNTER — Emergency Department (HOSPITAL_COMMUNITY)
Admission: EM | Admit: 2015-03-01 | Discharge: 2015-03-01 | Disposition: A | Discharge status: Home / Self Care | Payer: BC Managed Care – PPO | Attending: Emergency Medicine | Admitting: Emergency Medicine

## 2015-03-01 DIAGNOSIS — F17210 Nicotine dependence, cigarettes, uncomplicated: Secondary | ICD-10-CM | POA: Insufficient documentation

## 2015-03-01 DIAGNOSIS — Z880 Allergy status to penicillin: Secondary | ICD-10-CM | POA: Insufficient documentation

## 2015-03-01 DIAGNOSIS — Z87442 Personal history of urinary calculi: Secondary | ICD-10-CM | POA: Insufficient documentation

## 2015-03-01 DIAGNOSIS — R3911 Hesitancy of micturition: Secondary | ICD-10-CM | POA: Insufficient documentation

## 2015-03-01 DIAGNOSIS — R109 Unspecified abdominal pain: Secondary | ICD-10-CM | POA: Insufficient documentation

## 2015-03-01 LAB — URINALYSIS, ROUTINE W REFLEX MICROSCOPIC
BILIRUBIN URINE: NEGATIVE
Glucose, UA: NEGATIVE mg/dL
KETONES UR: NEGATIVE mg/dL
Nitrite: NEGATIVE
PROTEIN: NEGATIVE mg/dL
SPECIFIC GRAVITY, URINE: 1.018 (ref 1.005–1.030)
pH: 6 (ref 5.0–8.0)

## 2015-03-01 LAB — URINE MICROSCOPIC-ADD ON: Bacteria, UA: NONE SEEN

## 2015-03-01 LAB — I-STAT CHEM 8, ED
BUN: 9 mg/dL (ref 6–20)
CALCIUM ION: 1.21 mmol/L (ref 1.12–1.23)
Chloride: 103 mmol/L (ref 101–111)
Creatinine, Ser: 1 mg/dL (ref 0.61–1.24)
Glucose, Bld: 74 mg/dL (ref 65–99)
HCT: 46 % (ref 39.0–52.0)
Hemoglobin: 15.6 g/dL (ref 13.0–17.0)
Potassium: 4.1 mmol/L (ref 3.5–5.1)
SODIUM: 139 mmol/L (ref 135–145)
TCO2: 26 mmol/L (ref 0–100)

## 2015-03-01 MED ORDER — ONDANSETRON HCL 4 MG PO TABS: 4.0000 mg | ORAL_TABLET | Freq: Four times a day (QID) | ORAL | Status: DC

## 2015-03-01 MED ORDER — OXYCODONE-ACETAMINOPHEN 5-325 MG PO TABS
2.0000 | ORAL_TABLET | Freq: Once | ORAL | Status: AC
  Administered 2015-03-01: 2 via ORAL
  Filled 2015-03-01: qty 2

## 2015-03-01 MED ORDER — IBUPROFEN 600 MG PO TABS: 600.0000 mg | ORAL_TABLET | Freq: Four times a day (QID) | ORAL | Status: DC | PRN

## 2015-03-01 MED ORDER — TAMSULOSIN HCL 0.4 MG PO CAPS: 0.4000 mg | ORAL_CAPSULE | Freq: Two times a day (BID) | ORAL | Status: DC

## 2015-03-01 MED ORDER — ONDANSETRON 4 MG PO TBDP
4.0000 mg | ORAL_TABLET | Freq: Once | ORAL | Status: AC
  Administered 2015-03-01: 4 mg via ORAL
  Filled 2015-03-01: qty 1

## 2015-03-01 MED ORDER — OXYCODONE-ACETAMINOPHEN 5-325 MG PO TABS: 1.0000 | ORAL_TABLET | ORAL | Status: DC | PRN

## 2015-03-01 NOTE — Discharge Instructions (Signed)
You were evaluated in the ED today for your flank pain. Your symptoms are mostly consistent with a kidney stone. You'll be treated with Flomax, antinausea medicine, pain medicine and Motrin. Take your Motrin for moderate pain and your Percocet for breakthrough pain. Follow up with your urologist next week if your symptoms do not improve. Return to ED for any new or worsening symptoms.  Flank Pain Flank pain refers to pain that is located on the side of the body between the upper abdomen and the back. The pain may occur over a short period of time (acute) or may be long-term or reoccurring (chronic). It may be mild or severe. Flank pain can be caused by many things. CAUSES  Some of the more common causes of flank pain include:  Muscle strains.   Muscle spasms.   A disease of your spine (vertebral disk disease).   A lung infection (pneumonia).   Fluid around your lungs (pulmonary edema).   A kidney infection.   Kidney stones.   A very painful skin rash caused by the chickenpox virus (shingles).   Gallbladder disease.  HOME CARE INSTRUCTIONS  Home care will depend on the cause of your pain. In general,  Rest as directed by your caregiver.  Drink enough fluids to keep your urine clear or pale yellow.  Only take over-the-counter or prescription medicines as directed by your caregiver. Some medicines may help relieve the pain.  Tell your caregiver about any changes in your pain.  Follow up with your caregiver as directed. SEEK IMMEDIATE MEDICAL CARE IF:   Your pain is not controlled with medicine.   You have new or worsening symptoms.  Your pain increases.   You have abdominal pain.   You have shortness of breath.   You have persistent nausea or vomiting.   You have swelling in your abdomen.   You feel faint or pass out.   You have blood in your urine.  You have a fever or persistent symptoms for more than 2-3 days.  You have a fever and your  symptoms suddenly get worse. MAKE SURE YOU:   Understand these instructions.  Will watch your condition.  Will get help right away if you are not doing well or get worse.   This information is not intended to replace advice given to you by your health care provider. Make sure you discuss any questions you have with your health care provider.   Document Released: 05/02/2005 Document Revised: 12/04/2011 Document Reviewed: 10/24/2011 Elsevier Interactive Patient Education [employer]2016 [employer]vier Inc.

## 2015-03-01 NOTE — ED Notes (Signed)
Pt c/o L flank pain onset x 2-3 days, pt hx of kidney stones in 09/2014, pt denies dysuria, pt denies n/v/d, pt denies hematuria

## 2015-03-01 NOTE — ED Notes (Signed)
Pt remains off unit with ultrasound

## 2015-03-01 NOTE — ED Provider Notes (Signed)
CSN: [id number]     Arrival date & time 03/01/15  0908 History   First MD Initiated Contact with Patient 03/01/15 1008     Chief Complaint  Patient presents with  . Flank Pain     (Consider location/radiation/quality/duration/timing/severity/associated sxs/prior Treatment) HPI Georgie ChardJohn S Haese is a 24 y.o. male with a history of kidney stone who comes in for evaluation of left flank pain. Patient reports pain started 2 days ago, is similar to his previous kidney stone pain. He has not taken anything to improve his symptoms. He characterizes the discomfort as a knifelike stabbing pain that is waxing and waning, he reports associated urinary hesitancy. He denies fevers, chills, nausea or vomiting, other abdominal pain, penile symptoms, rectal pain. Pain is moderate in the ED. No other modifying factors. Patient does have urologist he can follow-up with.  Past Medical History  Diagnosis Date  . Nephrolithiasis 07/2014    left   History reviewed. No pertinent past surgical history. No family history on file. Social History  Substance Use Topics  . Smoking status: Current Every Day Smoker -- 0.50 packs/day    Types: Cigarettes  . Smokeless tobacco: Never Used  . Alcohol Use: Yes     Comment: occa    Review of Systems A 10 point review of systems was completed and was negative except for pertinent positives and negatives as mentioned in the history of present illness     Allergies  Amoxicillin  Home Medications   Prior to Admission medications   Medication Sig Start Date End Date Taking? Authorizing Provider  ibuprofen (ADVIL,MOTRIN) 600 MG tablet Take 1 tablet (600 mg total) by mouth every 6 (six) hours as needed. 03/01/15   Joycie PeekBenjamin Celedonio Sortino, PA-C  ondansetron (ZOFRAN) 4 MG tablet Take 1 tablet (4 mg total) by mouth every 6 (six) hours. 03/01/15   Joycie PeekBenjamin Nadean Montanaro, PA-C  oxyCODONE-acetaminophen (PERCOCET/ROXICET) 5-325 MG tablet Take 1-2 tablets by mouth every 4 (four) hours as needed  for severe pain. 03/01/15   Joycie PeekBenjamin Brees Hounshell, PA-C  tamsulosin (FLOMAX) 0.4 MG CAPS capsule Take 1 capsule (0.4 mg total) by mouth 2 (two) times daily. 03/01/15   Alegria Dominique, PA-C   BP 104/72 mmHg  Pulse 50  Temp(Src) 98.4 F (36.9 C) (Oral)  Resp 16  Ht 5\' 8"  (1.727 m)  Wt 61.236 kg  BMI 20.53 kg/m2  SpO2 100% Physical Exam  Constitutional: He is oriented to person, place, and time. He appears well-developed and well-nourished.  Well-appearing African American male.  HENT:  Head: Normocephalic and atraumatic.  Mouth/Throat: Oropharynx is clear and moist.  Eyes: Conjunctivae are normal. Pupils are equal, round, and reactive to light. Right eye exhibits no discharge. Left eye exhibits no discharge. No scleral icterus.  Neck: Neck supple.  Cardiovascular: Normal rate, regular rhythm and normal heart sounds.   Pulmonary/Chest: Effort normal and breath sounds normal. No respiratory distress. He has no wheezes. He has no rales.  Abdominal: Soft. There is no tenderness.  Musculoskeletal: Normal range of motion.  Mild tenderness in left flank. No other rashes or deformities. No CVA tenderness.  Neurological: He is alert and oriented to person, place, and time.  Cranial Nerves II-XII grossly intact  Skin: Skin is warm and dry. No rash noted.  Psychiatric: He has a normal mood and affect.  Nursing note and vitals reviewed.   ED Course  Procedures (including critical care time) Labs Review Labs Reviewed  URINALYSIS, ROUTINE W REFLEX MICROSCOPIC (NOT AT [facility]) - Abnormal; Notable  for the following:    Hgb urine dipstick SMALL (*)    Leukocytes, UA SMALL (*)    All other components within normal limits  URINE MICROSCOPIC-ADD ON - Abnormal; Notable for the following:    Squamous Epithelial / LPF 0-5 (*)    All other components within normal limits  I-STAT CHEM 8, ED    Imaging Review US Renal  03/01/2015  CLINICAL DATA:  Left flank pain for 2 days, history of kidney stones EXAM:  RENAL / URINARY TRACT ULTRASOUND COMPLETE COMPARISON:  12/18/2014 FINDINGS: Right Kidney: Length: 10.3 cm. Echogenicity within normal limits. No mass or hydronephrosis visualized. Left Kidney: Length: 10 cm. Echogenicity within normal limits. No mass or hydronephrosis visualized. Bladder: Appears normal for degree of bladder distention. IMPRESSION: No hydronephrosis. No definite renal calculi are identified. Unremarkable urinary bladder. Electronically Signed   By: Natasha Mead M.D.   On: 03/01/2015 12:29   I have personally reviewed and evaluated these images and lab results as part of my medical decision-making.   EKG Interpretation None     Meds given in ED:  Medications  oxyCODONE-acetaminophen (PERCOCET/ROXICET) 5-325 MG per tablet 2 tablet (2 tablets Oral Given 03/01/15 1107)  ondansetron (ZOFRAN-ODT) disintegrating tablet 4 mg (4 mg Oral Given 03/01/15 1108)    New Prescriptions   IBUPROFEN (ADVIL,MOTRIN) 600 MG TABLET    Take 1 tablet (600 mg total) by mouth every 6 (six) hours as needed.   ONDANSETRON (ZOFRAN) 4 MG TABLET    Take 1 tablet (4 mg total) by mouth every 6 (six) hours.   OXYCODONE-ACETAMINOPHEN (PERCOCET/ROXICET) 5-325 MG TABLET    Take 1-2 tablets by mouth every 4 (four) hours as needed for severe pain.   TAMSULOSIN (FLOMAX) 0.4 MG CAPS CAPSULE    Take 1 capsule (0.4 mg total) by mouth 2 (two) times daily.   Filed Vitals:   03/01/15 1130 03/01/15 1145 03/01/15 1231 03/01/15 1245  BP: 100/55 86/62 94/54  104/72  Pulse: 79 55 77 50  Temp:      TempSrc:      Resp:   18 16  Height:      Weight:      SpO2: 100% 100% 100% 100%    MDM  TIBURCIO LINDER is a 24 y.o. male with a history of nephrolithiasis in the past comes in for evaluation of left flank pain. Patient reports pain is similar to previous kidney stone pain. No fevers, chills, other abdominal pain, nausea or vomiting, urinary symptoms. On arrival, patient is hemodynamically stable, normal vital signs and is  afebrile. Urinalysis shows evidence of stone, small hemoglobin. Creatinine is stable at 1.00. No CVA tenderness on exam. Ultrasound shows no hydronephrosis or identifiable stone. We'll treat for nonobstructing stone with anti-emetics, short course pain medicines. Encouraged patient to follow up with urology next week if symptoms do not improve. Return precautions given. Patient verbalizes understanding and agrees to this plan. No evidence of uremia, septic stone, pyelonephritis or other acute or emergent pathology. Overall, patient appears well, nontoxic and is appropriate for outpatient follow-up. Final diagnoses:  Left flank pain        Joycie Peek, PA-C 03/01/15 1302  Lavera Guise, MD 03/01/15 1705

## 2015-03-06 ENCOUNTER — Telehealth: Payer: Self-pay | Admitting: *Deleted

## 2015-03-06 NOTE — Telephone Encounter (Signed)
Patient verified DOB Patient states he needs medical clearance in order to return to work. Patient has been out of work and has been seen by a urologist for his kidney stones. [facility] since the first of the year. Medical Assistant advised patient to reach out to Urologist who was monitoring his condition and see if clearance will be given from specialist. Please follow-up with patient if clearance can be formulated by PCP

## 2015-08-13 ENCOUNTER — Encounter (HOSPITAL_COMMUNITY): Payer: Self-pay | Admitting: Emergency Medicine

## 2015-08-13 ENCOUNTER — Emergency Department (HOSPITAL_COMMUNITY)
Admission: EM | Admit: 2015-08-13 | Discharge: 2015-08-14 | Disposition: A | Discharge status: Home / Self Care | Payer: BC Managed Care – PPO | Attending: Emergency Medicine | Admitting: Emergency Medicine

## 2015-08-13 DIAGNOSIS — R109 Unspecified abdominal pain: Secondary | ICD-10-CM | POA: Insufficient documentation

## 2015-08-13 DIAGNOSIS — F17210 Nicotine dependence, cigarettes, uncomplicated: Secondary | ICD-10-CM | POA: Insufficient documentation

## 2015-08-13 LAB — CBC WITH DIFFERENTIAL/PLATELET
BASOS ABS: 0 10*3/uL (ref 0.0–0.1)
Basophils Relative: 0 %
EOS PCT: 0 %
Eosinophils Absolute: 0 10*3/uL (ref 0.0–0.7)
HCT: 38.1 % — ABNORMAL LOW (ref 39.0–52.0)
Hemoglobin: 13.5 g/dL (ref 13.0–17.0)
LYMPHS PCT: 10 %
Lymphs Abs: 1.3 10*3/uL (ref 0.7–4.0)
MCH: 33.5 pg (ref 26.0–34.0)
MCHC: 35.4 g/dL (ref 30.0–36.0)
MCV: 94.5 fL (ref 78.0–100.0)
Monocytes Absolute: 1.1 10*3/uL — ABNORMAL HIGH (ref 0.1–1.0)
Monocytes Relative: 8 %
NEUTROS ABS: 10.8 10*3/uL — AB (ref 1.7–7.7)
Neutrophils Relative %: 82 %
Platelets: 190 10*3/uL (ref 150–400)
RBC: 4.03 MIL/uL — AB (ref 4.22–5.81)
RDW: 13.2 % (ref 11.5–15.5)
WBC: 13.3 10*3/uL — AB (ref 4.0–10.5)

## 2015-08-13 MED ORDER — ONDANSETRON HCL 4 MG/2ML IJ SOLN
4.0000 mg | Freq: Once | INTRAMUSCULAR | Status: AC
  Administered 2015-08-13: 4 mg via INTRAVENOUS
  Filled 2015-08-13: qty 2

## 2015-08-13 MED ORDER — SODIUM CHLORIDE 0.9 % IV BOLUS (SEPSIS)
1000.0000 mL | Freq: Once | INTRAVENOUS | Status: AC
  Administered 2015-08-13: 1000 mL via INTRAVENOUS

## 2015-08-13 MED ORDER — HYDROMORPHONE HCL 1 MG/ML IJ SOLN
1.0000 mg | Freq: Once | INTRAMUSCULAR | Status: AC
  Administered 2015-08-13: 1 mg via INTRAVENOUS
  Filled 2015-08-13: qty 1

## 2015-08-13 NOTE — ED Provider Notes (Signed)
CSN: [id number]     Arrival date & time 08/13/15  2220 History   First MD Initiated Contact with Patient 08/13/15 2234     Chief Complaint  Patient presents with  . Flank Pain  PT SAID THAT HE DEVELOPED RIGHT FLANK PAIN AROUND 2000.  PT HAS A HX OF KIDNEY STONES, AND THIS FEELS SIMILAR.  THE PT WAS GIVEN ZOFRAN EN ROUTE BY EMS, BUT HE STILL FEELS NAUSEOUS.    (Consider location/radiation/quality/duration/timing/severity/associated sxs/prior Treatment) Patient is a 25 y.o. male presenting with flank pain. The history is provided by the patient.  Flank Pain This is a recurrent problem. The current episode started 3 to 5 hours ago. The problem occurs constantly. The problem has been gradually worsening. Associated symptoms include abdominal pain. Nothing relieves the symptoms.    Past Medical History  Diagnosis Date  . Nephrolithiasis 07/2014    left   History reviewed. No pertinent past surgical history. Family History  Problem Relation Age of Onset  . Diabetes Mother   . Fibromyalgia Mother   . Hypertension Mother   . Diabetes Other    Social History  Substance Use Topics  . Smoking status: Current Every Day Smoker -- 0.50 packs/day    Types: Cigarettes  . Smokeless tobacco: Never Used  . Alcohol Use: Yes     Comment: minimal     Review of Systems  Gastrointestinal: Positive for nausea, vomiting and abdominal pain.  Genitourinary: Positive for flank pain.  All other systems reviewed and are negative.     Allergies  Amoxicillin  Home Medications   Prior to Admission medications   Medication Sig Start Date End Date Taking? Authorizing Provider  ondansetron (ZOFRAN) 4 MG tablet Take 1 tablet (4 mg total) by mouth every 6 (six) hours as needed for nausea or vomiting. 08/14/15   Gilda Creasehristopher J Pollina, MD  promethazine (PHENERGAN) 25 MG tablet Take 1 tablet (25 mg total) by mouth every 6 (six) hours as needed for nausea or vomiting. 08/14/15   Gilda Creasehristopher J Pollina, MD    BP 100/60 mmHg  Pulse 79  Temp(Src) 97.9 F (36.6 C) (Oral)  Resp 14  SpO2 98% Physical Exam  Constitutional: He is oriented to person, place, and time. He appears well-developed and well-nourished. He appears distressed.  HENT:  Head: Normocephalic and atraumatic.  Right Ear: External ear normal.  Left Ear: External ear normal.  Nose: Nose normal.  Mouth/Throat: Oropharynx is clear and moist.  Eyes: Conjunctivae and EOM are normal. Pupils are equal, round, and reactive to light.  Neck: Normal range of motion. Neck supple.  Cardiovascular: Normal rate, regular rhythm, normal heart sounds and intact distal pulses.   Pulmonary/Chest: Effort normal and breath sounds normal.  Abdominal: Soft. Bowel sounds are normal. There is tenderness.  Musculoskeletal: Normal range of motion.  Neurological: He is alert and oriented to person, place, and time.  Skin: Skin is warm and dry.  Psychiatric: He has a normal mood and affect. His behavior is normal. Judgment and thought content normal.  Nursing note and vitals reviewed.   ED Course  Procedures (including critical care time) Labs Review Labs Reviewed  COMPREHENSIVE METABOLIC PANEL - Abnormal; Notable for the following:    Glucose, Bld 124 (*)    All other components within normal limits  CBC WITH DIFFERENTIAL/PLATELET - Abnormal; Notable for the following:    WBC 13.3 (*)    RBC 4.03 (*)    HCT 38.1 (*)    Neutro Abs 10.8 (*)  Monocytes Absolute 1.1 (*)    All other components within normal limits  URINALYSIS, ROUTINE W REFLEX MICROSCOPIC (NOT AT [facility]) - Abnormal; Notable for the following:    Protein, ur 30 (*)    All other components within normal limits  URINE MICROSCOPIC-ADD ON - Abnormal; Notable for the following:    Bacteria, UA RARE (*)    All other components within normal limits    Imaging Review No results found. I have personally reviewed and evaluated these images and lab results as part of my medical  decision-making.   EKG Interpretation None      MDM  Pt signed out to Dr. Blinda Leatherwood who will follow UA and pain and nausea.  I suspect pt has a kidney stone. Final diagnoses:  Flank pain        Jacalyn Lefevre, MD 08/14/15 [phone]

## 2015-08-13 NOTE — ED Notes (Signed)
Pt attempted to give urine sample unable at this time. Pt is actively vomiting

## 2015-08-13 NOTE — ED Notes (Signed)
Pt c/o severe pain to flanks, moving from R to L onset 2000, pt currently retching into trash at the foot of the bed. Denies dysuria. Kidney stone last year he was able to pass on its own.

## 2015-08-13 NOTE — ED Notes (Signed)
Pt brought in by EMS with c/o right flank pain that started at 2000 with nausea and vomiting  Pt has hx of kidney stones  EMS started an IV and gave Zofran 4 mg IV prior to arrival

## 2015-08-14 LAB — URINALYSIS, ROUTINE W REFLEX MICROSCOPIC
BILIRUBIN URINE: NEGATIVE
GLUCOSE, UA: NEGATIVE mg/dL
HGB URINE DIPSTICK: NEGATIVE
KETONES UR: NEGATIVE mg/dL
Leukocytes, UA: NEGATIVE
NITRITE: NEGATIVE
PH: 8 (ref 5.0–8.0)
Protein, ur: 30 mg/dL — AB
SPECIFIC GRAVITY, URINE: 1.021 (ref 1.005–1.030)

## 2015-08-14 LAB — URINE MICROSCOPIC-ADD ON: Squamous Epithelial / LPF: NONE SEEN

## 2015-08-14 LAB — COMPREHENSIVE METABOLIC PANEL
ALK PHOS: 50 U/L (ref 38–126)
ALT: 28 U/L (ref 17–63)
AST: 32 U/L (ref 15–41)
Albumin: 4.5 g/dL (ref 3.5–5.0)
Anion gap: 10 (ref 5–15)
BUN: 6 mg/dL (ref 6–20)
CALCIUM: 9.1 mg/dL (ref 8.9–10.3)
CHLORIDE: 111 mmol/L (ref 101–111)
CO2: 22 mmol/L (ref 22–32)
CREATININE: 1.15 mg/dL (ref 0.61–1.24)
GFR calc non Af Amer: >60 mL/min (ref >60)
Glucose, Bld: 124 mg/dL — ABNORMAL HIGH (ref 65–99)
Potassium: 3.5 mmol/L (ref 3.5–5.1)
SODIUM: 143 mmol/L (ref 135–145)
Total Bilirubin: 0.9 mg/dL (ref 0.3–1.2)
Total Protein: 7.1 g/dL (ref 6.5–8.1)

## 2015-08-14 MED ORDER — ONDANSETRON HCL 4 MG PO TABS: 4.0000 mg | ORAL_TABLET | Freq: Four times a day (QID) | ORAL | Status: DC | PRN

## 2015-08-14 MED ORDER — PROCHLORPERAZINE EDISYLATE 5 MG/ML IJ SOLN
10.0000 mg | Freq: Once | INTRAMUSCULAR | Status: AC
  Administered 2015-08-14: 10 mg via INTRAVENOUS
  Filled 2015-08-14: qty 2

## 2015-08-14 MED ORDER — PROMETHAZINE HCL 25 MG PO TABS: 25.0000 mg | ORAL_TABLET | Freq: Four times a day (QID) | ORAL | Status: DC | PRN

## 2015-08-14 NOTE — ED Notes (Addendum)
Pt provided Ice chips, OK per MD, pt states he continues to be unable to provide urine sample at this time

## 2015-08-14 NOTE — Discharge Instructions (Signed)
Flank Pain  Flank pain refers to pain that is located on the side of the body between the upper abdomen and the back. The pain may occur over a short period of time (acute) or may be long-term or reoccurring (chronic). It may be mild or severe. Flank pain can be caused by many things.  CAUSES   Some of the more common causes of flank pain include:   Muscle strains.    Muscle spasms.    A disease of your spine (vertebral disk disease).    A lung infection (pneumonia).    Fluid around your lungs (pulmonary edema).    A kidney infection.    Kidney stones.    A very painful skin rash caused by the chickenpox virus (shingles).    Gallbladder disease.   HOME CARE INSTRUCTIONS   Home care will depend on the cause of your pain. In general,   Rest as directed by your caregiver.   Drink enough fluids to keep your urine clear or pale yellow.   Only take over-the-counter or prescription medicines as directed by your caregiver. Some medicines may help relieve the pain.   Tell your caregiver about any changes in your pain.   Follow up with your caregiver as directed.  SEEK IMMEDIATE MEDICAL CARE IF:    Your pain is not controlled with medicine.    You have new or worsening symptoms.   Your pain increases.    You have abdominal pain.    You have shortness of breath.    You have persistent nausea or vomiting.    You have swelling in your abdomen.    You feel faint or pass out.    You have blood in your urine.   You have a fever or persistent symptoms for more than 2-3 days.   You have a fever and your symptoms suddenly get worse.  MAKE SURE YOU:    Understand these instructions.   Will watch your condition.   Will get help right away if you are not doing well or get worse.     This information is not intended to replace advice given to you by your health care provider. Make sure you discuss any questions you have with your health care provider.     Document Released: 05/02/2005 Document  Revised: 12/04/2011 Document Reviewed: 10/24/2011  Elsevier Interactive Patient Education 2016 [employer]    Nausea and Vomiting  Nausea is a sick feeling that often comes before throwing up (vomiting). Vomiting is a reflex where stomach contents come out of your mouth. Vomiting can cause severe loss of body fluids (dehydration). Children and elderly adults can become dehydrated quickly, especially if they also have diarrhea. Nausea and vomiting are symptoms of a condition or disease. It is important to find the cause of your symptoms.  CAUSES    Direct irritation of the stomach lining. This irritation can result from increased acid production (gastroesophageal reflux disease), infection, food poisoning, taking certain medicines (such as nonsteroidal anti-inflammatory drugs), alcohol use, or tobacco use.   Signals from the brain.These signals could be caused by a headache, heat exposure, an inner ear disturbance, increased pressure in the brain from injury, infection, a tumor, or a concussion, pain, emotional stimulus, or metabolic problems.   An obstruction in the gastrointestinal tract (bowel obstruction).   Illnesses such as diabetes, hepatitis, gallbladder problems, appendicitis, kidney problems, cancer, sepsis, atypical symptoms of a heart attack, or eating disorders.   Medical treatments such as chemotherapy   and radiation.   Receiving medicine that makes you sleep (general anesthetic) during surgery.  DIAGNOSIS  Your caregiver may ask for tests to be done if the problems do not improve after a few days. Tests may also be done if symptoms are severe or if the reason for the nausea and vomiting is not clear. Tests may include:   Urine tests.   Blood tests.   Stool tests.   Cultures (to look for evidence of infection).   X-rays or other imaging studies.  Test results can help your caregiver make decisions about treatment or the need for additional tests.  TREATMENT  You need to stay well hydrated.  Drink frequently but in small amounts.You may wish to drink water, sports drinks, clear broth, or eat frozen ice pops or gelatin dessert to help stay hydrated.When you eat, eating slowly may help prevent nausea.There are also some antinausea medicines that may help prevent nausea.  HOME CARE INSTRUCTIONS    Take all medicine as directed by your caregiver.   If you do not have an appetite, do not force yourself to eat. However, you must continue to drink fluids.   If you have an appetite, eat a normal diet unless your caregiver tells you differently.    Eat a variety of complex carbohydrates (rice, wheat, potatoes, bread), lean meats, yogurt, fruits, and vegetables.    Avoid high-fat foods because they are more difficult to digest.   Drink enough water and fluids to keep your urine clear or pale yellow.   If you are dehydrated, ask your caregiver for specific rehydration instructions. Signs of dehydration may include:    Severe thirst.    Dry lips and mouth.    Dizziness.    Dark urine.    Decreasing urine frequency and amount.    Confusion.    Rapid breathing or pulse.  SEEK IMMEDIATE MEDICAL CARE IF:    You have blood or brown flecks (like coffee grounds) in your vomit.   You have black or bloody stools.   You have a severe headache or stiff neck.   You are confused.   You have severe abdominal pain.   You have chest pain or trouble breathing.   You do not urinate at least once every 8 hours.   You develop cold or clammy skin.   You continue to vomit for longer than 24 to 48 hours.   You have a fever.  MAKE SURE YOU:    Understand these instructions.   Will watch your condition.   Will get help right away if you are not doing well or get worse.     This information is not intended to replace advice given to you by your health care provider. Make sure you discuss any questions you have with your health care provider.     Document Released: 03/11/2005 Document Revised: 06/03/2011 Document  Reviewed: 08/08/2010  Elsevier Interactive Patient Education 2016 [employer]

## 2015-08-14 NOTE — ED Notes (Signed)
Pt resting on stretcher with eyes closed, RR even and unlabored, NAD 

## 2015-08-14 NOTE — ED Notes (Signed)
Pt resting on stretcher with eyes closed, no further emesis noted, RR even and unlabored, NAD

## 2015-08-14 NOTE — ED Provider Notes (Signed)
Patient signed out to me to follow-up on labs. Patient seen with flank pain, nausea and vomiting. He does have a history of renal stones. Patient thought to be experiencing renal colic. Patient took quite some time to give a urine sample. When it was available, however, there is no sign of infection and no microscopic hematuria. Reviewing his records reveals that he has been seen several times with similar presentations. Imaging has never documented ureteral stones. Patient is resting comfortably. I discussed with him that his pain is likely secondary to the vomiting, but I do not think he is experiencing renal colic currently. He will be treated with antiemetics and is in agreement with this plan.  Gilda Creasehristopher J Karaline Buresh, MD 08/14/15 [phone]

## 2015-08-29 ENCOUNTER — Emergency Department (HOSPITAL_COMMUNITY)
Admission: EM | Admit: 2015-08-29 | Discharge: 2015-08-29 | Disposition: A | Discharge status: Home / Self Care | Payer: BC Managed Care – PPO | Attending: Emergency Medicine | Admitting: Emergency Medicine

## 2015-08-29 ENCOUNTER — Encounter (HOSPITAL_COMMUNITY): Payer: Self-pay | Admitting: *Deleted

## 2015-08-29 DIAGNOSIS — Z880 Allergy status to penicillin: Secondary | ICD-10-CM | POA: Insufficient documentation

## 2015-08-29 DIAGNOSIS — F17210 Nicotine dependence, cigarettes, uncomplicated: Secondary | ICD-10-CM | POA: Insufficient documentation

## 2015-08-29 DIAGNOSIS — Z87442 Personal history of urinary calculi: Secondary | ICD-10-CM | POA: Insufficient documentation

## 2015-08-29 DIAGNOSIS — K920 Hematemesis: Secondary | ICD-10-CM | POA: Insufficient documentation

## 2015-08-29 LAB — BASIC METABOLIC PANEL
ANION GAP: 8 (ref 5–15)
BUN: 5 mg/dL — ABNORMAL LOW (ref 6–20)
CHLORIDE: 103 mmol/L (ref 101–111)
CO2: 27 mmol/L (ref 22–32)
CREATININE: 1.06 mg/dL (ref 0.61–1.24)
Calcium: 9.6 mg/dL (ref 8.9–10.3)
GFR calc non Af Amer: >60 mL/min (ref >60)
Glucose, Bld: 86 mg/dL (ref 65–99)
Potassium: 3.8 mmol/L (ref 3.5–5.1)
SODIUM: 138 mmol/L (ref 135–145)

## 2015-08-29 LAB — CBC WITH DIFFERENTIAL/PLATELET
BASOS ABS: 0 10*3/uL (ref 0.0–0.1)
BASOS PCT: 1 %
EOS ABS: 0.2 10*3/uL (ref 0.0–0.7)
Eosinophils Relative: 3 %
HCT: 44.5 % (ref 39.0–52.0)
HEMOGLOBIN: 15 g/dL (ref 13.0–17.0)
Lymphocytes Relative: 46 %
Lymphs Abs: 2.8 10*3/uL (ref 0.7–4.0)
MCH: 32.3 pg (ref 26.0–34.0)
MCHC: 33.7 g/dL (ref 30.0–36.0)
MCV: 95.7 fL (ref 78.0–100.0)
MONOS PCT: 9 %
Monocytes Absolute: 0.6 10*3/uL (ref 0.1–1.0)
NEUTROS ABS: 2.5 10*3/uL (ref 1.7–7.7)
NEUTROS PCT: 41 %
Platelets: 221 10*3/uL (ref 150–400)
RBC: 4.65 MIL/uL (ref 4.22–5.81)
RDW: 12.9 % (ref 11.5–15.5)
WBC: 6 10*3/uL (ref 4.0–10.5)

## 2015-08-29 LAB — LIPASE, BLOOD: LIPASE: 40 U/L (ref 11–51)

## 2015-08-29 LAB — URINALYSIS, ROUTINE W REFLEX MICROSCOPIC
BILIRUBIN URINE: NEGATIVE
Glucose, UA: NEGATIVE mg/dL
HGB URINE DIPSTICK: NEGATIVE
Ketones, ur: NEGATIVE mg/dL
Leukocytes, UA: NEGATIVE
NITRITE: NEGATIVE
PROTEIN: NEGATIVE mg/dL
Specific Gravity, Urine: 1.019 (ref 1.005–1.030)
pH: 8 (ref 5.0–8.0)

## 2015-08-29 MED ORDER — ESOMEPRAZOLE MAGNESIUM 40 MG PO CPDR: 40.0000 mg | DELAYED_RELEASE_CAPSULE | Freq: Every day | ORAL | Status: DC

## 2015-08-29 MED ORDER — SUCRALFATE 1 G PO TABS: 1.0000 g | ORAL_TABLET | Freq: Three times a day (TID) | ORAL | Status: DC

## 2015-08-29 NOTE — ED Notes (Signed)
Patient presents stating he woke up and could feel blood in his mouth.  Vomited several times and "it was nothing but blood"

## 2015-08-29 NOTE — ED Provider Notes (Signed)
CSN: [id number]     Arrival date & time 08/29/15  0257 History   First MD Initiated Contact with Patient 08/29/15 [phone]     Chief Complaint  Patient presents with  . Hemoptysis     (Consider location/radiation/quality/duration/timing/severity/associated sxs/prior Treatment) HPI Patient presents to the emergency department with vomiting blood.  The patient states that he had several episodes of vomiting blood morning.  The patient states that ever had this happen previously.  He does not have any blood in his stool. The patient denies chest pain, shortness of breath, headache,blurred vision, neck pain, fever, cough, weakness, numbness, dizziness, anorexia, edema, abdominal pain, nausea, vomiting, diarrhea, rash, back pain, dysuria, bloody stool, near syncope, or syncope.  Patient states that nothing seems to make the condition better or worse.  He states he did not take any medications prior to arrival Past Medical History  Diagnosis Date  . Nephrolithiasis 07/2014    left   History reviewed. No pertinent past surgical history. Family History  Problem Relation Age of Onset  . Diabetes Mother   . Fibromyalgia Mother   . Hypertension Mother   . Diabetes Other    Social History  Substance Use Topics  . Smoking status: Current Every Day Smoker -- 0.50 packs/day    Types: Cigarettes  . Smokeless tobacco: Never Used  . Alcohol Use: Yes     Comment: minimal     Review of Systems   All other systems negative except as documented in the HPI. All pertinent positives and negatives as reviewed in the HPI. Allergies  Amoxicillin  Home Medications   Prior to Admission medications   Medication Sig Start Date End Date Taking? Authorizing Provider  ondansetron (ZOFRAN) 4 MG tablet Take 1 tablet (4 mg total) by mouth every 6 (six) hours as needed for nausea or vomiting. 08/14/15   Gilda Creasehristopher J Pollina, MD  promethazine (PHENERGAN) 25 MG tablet Take 1 tablet (25 mg total) by mouth every 6  (six) hours as needed for nausea or vomiting. 08/14/15   Gilda Creasehristopher J Pollina, MD   BP 105/69 mmHg  Pulse 57  Temp(Src) 98.1 F (36.7 C) (Oral)  Resp 16  Ht 5\' 8"  (1.727 m)  Wt 59.421 kg  BMI 19.92 kg/m2  SpO2 98% Physical Exam  Constitutional: He is oriented to person, place, and time. He appears well-developed and well-nourished. No distress.  HENT:  Head: Normocephalic and atraumatic.  Mouth/Throat: Oropharynx is clear and moist.  Eyes: Pupils are equal, round, and reactive to light.  Neck: Normal range of motion. Neck supple.  Cardiovascular: Normal rate, regular rhythm and normal heart sounds.  Exam reveals no gallop and no friction rub.   No murmur heard. Pulmonary/Chest: Effort normal and breath sounds normal. No respiratory distress. He has no wheezes.  Abdominal: Soft. Bowel sounds are normal. He exhibits no distension. There is no tenderness.  Musculoskeletal: He exhibits no edema.  Neurological: He is alert and oriented to person, place, and time. He exhibits normal muscle tone. Coordination normal.  Skin: Skin is warm and dry. No rash noted. No erythema.  Psychiatric: He has a normal mood and affect. His behavior is normal.  Nursing note and vitals reviewed.   ED Course  Procedures (including critical care time) Labs Review Labs Reviewed  BASIC METABOLIC PANEL - Abnormal; Notable for the following:    BUN 5 (*)    All other components within normal limits  URINALYSIS, ROUTINE W REFLEX MICROSCOPIC (NOT AT [facility]) - Abnormal; Notable  for the following:    APPearance CLOUDY (*)    All other components within normal limits  CBC WITH DIFFERENTIAL/PLATELET  LIPASE, BLOOD    Imaging Review No results found. I have personally reviewed and evaluated these images and lab results as part of my medical decision-making.   EKG Interpretation None      MDM   Final diagnoses:  None    Patient will be referred to GI for follow-up.  The patient does not have any  further episodes and no abdominal pain.  Patient is advised to return here as needed.  Patient agrees the plan and all questions were answered.  Patient is had no abdominal pain, I am not totally sure if we can explain the cause of this bleeding, but at this time.  He stable and so is his laboratory testing.  His vital signs have been normal    Charlestine Night, PA-C 08/30/15 1604  Glynn Octave, MD 08/30/15 [phone]

## 2015-08-29 NOTE — Discharge Instructions (Signed)
Return here as needed. Follow up with the GI doctor provided.  °

## 2015-12-01 IMAGING — CR DG ABDOMEN 1V
1 series · 1 of 1 positions shown · non-contrast
Comparison: 07/18/2014.  CT abdomen and pelvis 07/13/2014.

CLINICAL DATA: Left ureteral stone.  Pre lithotripsy.

EXAM:
ABDOMEN - 1 VIEW

[t abdomen supine]
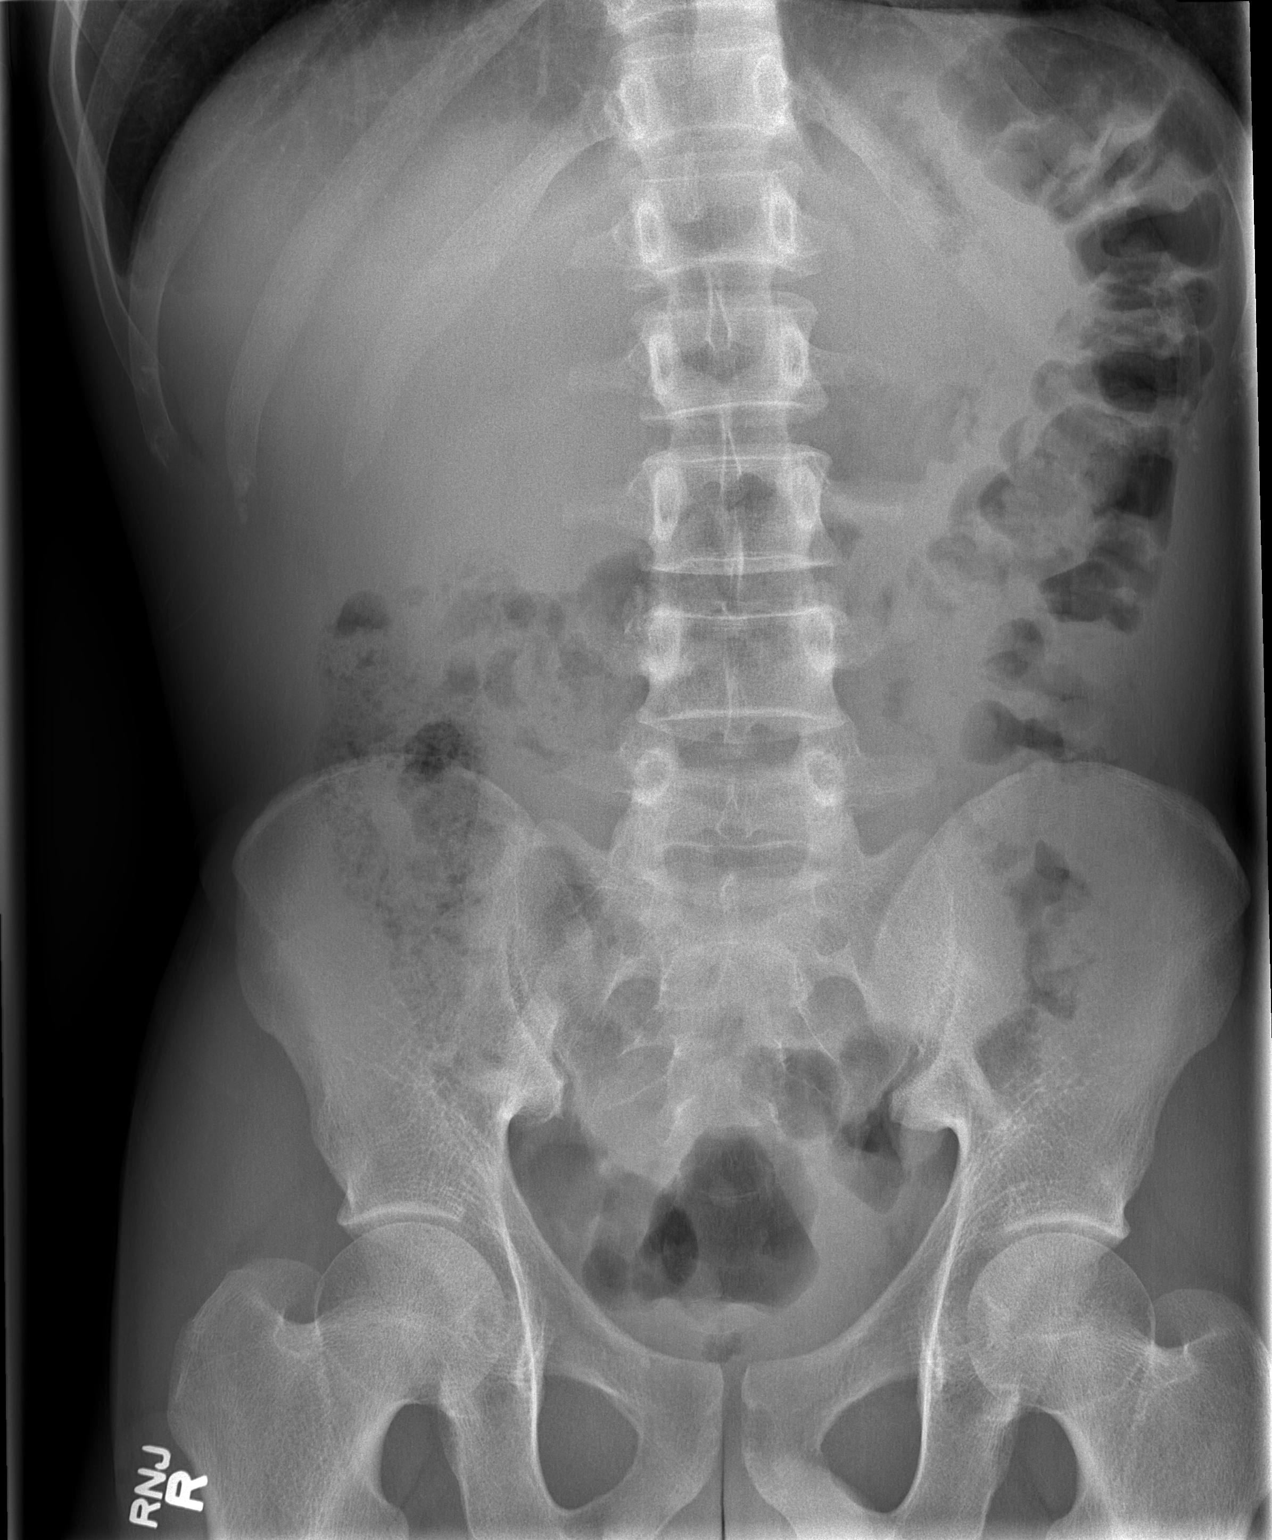

[1 of 1 positions shown; findings below may reference images not displayed]

FINDINGS: The bowel gas pattern is normal. No radio-opaque calculi or other
significant radiographic abnormality are seen.
IMPRESSION: Negative.

## 2016-01-17 ENCOUNTER — Emergency Department (HOSPITAL_COMMUNITY): Payer: Self-pay

## 2016-01-17 ENCOUNTER — Encounter (HOSPITAL_COMMUNITY): Payer: Self-pay | Admitting: Emergency Medicine

## 2016-01-17 ENCOUNTER — Emergency Department (HOSPITAL_COMMUNITY)
Admission: EM | Admit: 2016-01-17 | Discharge: 2016-01-17 | Disposition: A | Discharge status: Home / Self Care | Payer: Self-pay | Attending: Emergency Medicine | Admitting: Emergency Medicine

## 2016-01-17 DIAGNOSIS — R197 Diarrhea, unspecified: Secondary | ICD-10-CM

## 2016-01-17 DIAGNOSIS — R112 Nausea with vomiting, unspecified: Secondary | ICD-10-CM

## 2016-01-17 DIAGNOSIS — N200 Calculus of kidney: Secondary | ICD-10-CM

## 2016-01-17 DIAGNOSIS — Z79899 Other long term (current) drug therapy: Secondary | ICD-10-CM | POA: Insufficient documentation

## 2016-01-17 DIAGNOSIS — R109 Unspecified abdominal pain: Secondary | ICD-10-CM

## 2016-01-17 DIAGNOSIS — F17210 Nicotine dependence, cigarettes, uncomplicated: Secondary | ICD-10-CM | POA: Insufficient documentation

## 2016-01-17 LAB — COMPREHENSIVE METABOLIC PANEL
ALBUMIN: 4.8 g/dL (ref 3.5–5.0)
ALT: 27 U/L (ref 17–63)
AST: 39 U/L (ref 15–41)
Alkaline Phosphatase: 59 U/L (ref 38–126)
Anion gap: 13 (ref 5–15)
BUN: 13 mg/dL (ref 6–20)
CHLORIDE: 108 mmol/L (ref 101–111)
CO2: 18 mmol/L — AB (ref 22–32)
CREATININE: 1.01 mg/dL (ref 0.61–1.24)
Calcium: 9.3 mg/dL (ref 8.9–10.3)
GFR calc Af Amer: >60 mL/min (ref >60)
GLUCOSE: 102 mg/dL — AB (ref 65–99)
Potassium: 3.3 mmol/L — ABNORMAL LOW (ref 3.5–5.1)
Sodium: 139 mmol/L (ref 135–145)
Total Bilirubin: 0.9 mg/dL (ref 0.3–1.2)
Total Protein: 7.5 g/dL (ref 6.5–8.1)

## 2016-01-17 LAB — CBC
HCT: 40.7 % (ref 39.0–52.0)
Hemoglobin: 14.5 g/dL (ref 13.0–17.0)
MCH: 33.4 pg (ref 26.0–34.0)
MCHC: 35.6 g/dL (ref 30.0–36.0)
MCV: 93.8 fL (ref 78.0–100.0)
PLATELETS: 214 10*3/uL (ref 150–400)
RBC: 4.34 MIL/uL (ref 4.22–5.81)
RDW: 12.9 % (ref 11.5–15.5)
WBC: 10.4 10*3/uL (ref 4.0–10.5)

## 2016-01-17 LAB — URINALYSIS, ROUTINE W REFLEX MICROSCOPIC
Bilirubin Urine: NEGATIVE
GLUCOSE, UA: NEGATIVE mg/dL
Leukocytes, UA: NEGATIVE
Nitrite: NEGATIVE
PH: 7 (ref 5.0–8.0)
PROTEIN: NEGATIVE mg/dL
Specific Gravity, Urine: >1.046 — ABNORMAL HIGH (ref 1.005–1.030)

## 2016-01-17 LAB — URINE MICROSCOPIC-ADD ON

## 2016-01-17 LAB — RAPID URINE DRUG SCREEN, HOSP PERFORMED
Amphetamines: NOT DETECTED
BARBITURATES: NOT DETECTED
Benzodiazepines: NOT DETECTED
Cocaine: NOT DETECTED
Opiates: NOT DETECTED
Tetrahydrocannabinol: POSITIVE — AB

## 2016-01-17 LAB — LIPASE, BLOOD: LIPASE: 27 U/L (ref 11–51)

## 2016-01-17 MED ORDER — IOPAMIDOL (ISOVUE-300) INJECTION 61%
15.0000 mL | Freq: Once | INTRAVENOUS | Status: AC | PRN
  Administered 2016-01-17: 15 mL via ORAL

## 2016-01-17 MED ORDER — HYDROCODONE-ACETAMINOPHEN 5-325 MG PO TABS: 2.0000 | ORAL_TABLET | ORAL | 0 refills | Status: DC | PRN

## 2016-01-17 MED ORDER — IBUPROFEN 800 MG PO TABS: 800.0000 mg | ORAL_TABLET | Freq: Three times a day (TID) | ORAL | 0 refills | Status: DC

## 2016-01-17 MED ORDER — LORAZEPAM 2 MG/ML IJ SOLN
1.0000 mg | Freq: Once | INTRAMUSCULAR | Status: AC
  Administered 2016-01-17: 1 mg via INTRAVENOUS
  Filled 2016-01-17: qty 1

## 2016-01-17 MED ORDER — ONDANSETRON HCL 4 MG/2ML IJ SOLN
4.0000 mg | Freq: Once | INTRAMUSCULAR | Status: AC | PRN
  Administered 2016-01-17: 4 mg via INTRAVENOUS
  Filled 2016-01-17: qty 2

## 2016-01-17 MED ORDER — SODIUM CHLORIDE 0.9 % IV SOLN: 1000.0000 mL | INTRAVENOUS | Status: DC

## 2016-01-17 MED ORDER — DIPHENHYDRAMINE HCL 50 MG/ML IJ SOLN
25.0000 mg | Freq: Once | INTRAMUSCULAR | Status: AC
  Administered 2016-01-17: 25 mg via INTRAVENOUS
  Filled 2016-01-17: qty 1

## 2016-01-17 MED ORDER — IOPAMIDOL (ISOVUE-300) INJECTION 61%
100.0000 mL | Freq: Once | INTRAVENOUS | Status: AC | PRN
  Administered 2016-01-17: 100 mL via INTRAVENOUS

## 2016-01-17 MED ORDER — TAMSULOSIN HCL 0.4 MG PO CAPS: 0.4000 mg | ORAL_CAPSULE | Freq: Two times a day (BID) | ORAL | 0 refills | Status: DC

## 2016-01-17 MED ORDER — SODIUM CHLORIDE 0.9 % IV SOLN
1000.0000 mL | Freq: Once | INTRAVENOUS | Status: AC
  Administered 2016-01-17: 1000 mL via INTRAVENOUS

## 2016-01-17 MED ORDER — ONDANSETRON 4 MG PO TBDP
4.0000 mg | ORAL_TABLET | Freq: Three times a day (TID) | ORAL | 0 refills | Status: DC | PRN
Start: 2016-01-17 — End: 2017-11-30

## 2016-01-17 MED ORDER — SODIUM CHLORIDE 0.9 % IV BOLUS (SEPSIS)
1000.0000 mL | Freq: Once | INTRAVENOUS | Status: AC
  Administered 2016-01-17: 1000 mL via INTRAVENOUS

## 2016-01-17 MED ORDER — HALOPERIDOL LACTATE 5 MG/ML IJ SOLN: 2.0000 mg | Freq: Once | INTRAMUSCULAR | Status: DC

## 2016-01-17 MED ORDER — PROCHLORPERAZINE EDISYLATE 5 MG/ML IJ SOLN
10.0000 mg | Freq: Once | INTRAMUSCULAR | Status: AC
  Administered 2016-01-17: 10 mg via INTRAVENOUS
  Filled 2016-01-17: qty 2

## 2016-01-17 MED ORDER — METOCLOPRAMIDE HCL 5 MG/ML IJ SOLN
10.0000 mg | Freq: Once | INTRAMUSCULAR | Status: AC
  Administered 2016-01-17: 10 mg via INTRAVENOUS
  Filled 2016-01-17: qty 2

## 2016-01-17 NOTE — ED Notes (Addendum)
Pt is not responding when speaking to him.

## 2016-01-17 NOTE — ED Notes (Signed)
Attempted VS. Pt continues to move, now flipping to place his head at the foot of the bed. Unable to obtain BP.

## 2016-01-17 NOTE — ED Notes (Signed)
Brought urinal to bedside twice. Was thrown in floor.

## 2016-01-17 NOTE — ED Notes (Signed)
Taquita, RN given report. 

## 2016-01-17 NOTE — Discharge Instructions (Addendum)
You have a kidney stone that is going to take a few days to pass - see the phone number above for the Urologist - please follow-up within the next 3 days if you have not passed a kidney stone, he may return to the emergency department if her pain or vomiting become severe or worsened and is not controlled by pain medication as prescribed.  Please obtain all of your results from medical records or have your doctors office obtain the results - share them with your doctor - you should be seen at your doctors office in the next 2 days. Call today to arrange your follow up. Take the medications as prescribed. Please review all of the medicines and only take them if you do not have an allergy to them. Please be aware that if you are taking birth control pills, taking other prescriptions, ESPECIALLY ANTIBIOTICS may make the birth control ineffective - if this is the case, either do not engage in sexual activity or use alternative methods of birth control such as condoms until you have finished the medicine and your family doctor says it is OK to restart them. If you are on a blood thinner such as COUMADIN, be aware that any other medicine that you take may cause the coumadin to either work too much, or not enough - you should have your coumadin level rechecked in next 7 days if this is the case.  ?  It is also a possibility that you have an allergic reaction to any of the medicines that you have been prescribed - Everybody reacts differently to medications and while MOST people have no trouble with most medicines, you may have a reaction such as nausea, vomiting, rash, swelling, shortness of breath. If this is the case, please stop taking the medicine immediately and contact your physician.  ?  You should return to the ER if you develop severe or worsening symptoms.

## 2016-01-17 NOTE — ED Notes (Signed)
Pt more cooperative at this time. Lying still in bed, no longer sitting in a crouched position on the bed.

## 2016-01-17 NOTE — ED Provider Notes (Signed)
[facility]-EMERGENCY DEPT Provider Note   CSN: [id number] Arrival date & time: 01/17/16  0204  By signing my name below, I, Emmanuella Mensah, attest that this documentation has been prepared under the direction and in the presence of Devoria Albe, MD. Electronically Signed: Angelene Giovanni, ED Scribe. 01/17/16. 3:08 AM.   Time seen 02:59 AM  History   Chief Complaint Chief Complaint  Patient presents with  . Abdominal Pain  . Nausea  . Emesis  . Diarrhea   HPI Comments: Level 5 Caveat due to distress from pain   Jeffrey Bautista is a 25 y.o. male brought in by ambulance, who presents to the Emergency Department complaining of gradually worsening moderate lower abdominal pain he describes as cramping onset one hour s/p eating at [employer] at 1 am today. He reports associated nausea, multiple episodes of non-bloody vomiting, and multiple episodes of non-bloody diarrhea. No alleviating factors noted. Pt has not tried any medications PTA. He denies any sick contacts. Pt has an allergy to Amoxicillin. No other complaints at this time. Patient denies any prior problem with his abdomen.  The history is provided by the patient. The history is limited by the condition of the patient. No language interpreter was used.   PCP unknown  Past Medical History:  Diagnosis Date  . Nephrolithiasis 07/2014   left    Patient Active Problem List   Diagnosis Date Noted  . Carpal tunnel syndrome 04/07/2014  . Sciatica of left side 02/03/2012  . Rash 06/04/2011    History reviewed. No pertinent surgical history.     Home Medications    Denies taking medications  Prior to Admission medications   Medication Sig Start Date End Date Taking? Authorizing Provider  esomeprazole (NEXIUM) 40 MG capsule Take 1 capsule (40 mg total) by mouth daily. Patient not taking: Reported on 01/17/2016 08/29/15   Charlestine Night, PA-C  ondansetron (ZOFRAN) 4 MG tablet Take 1 tablet (4 mg total) by mouth every 6 (six)  hours as needed for nausea or vomiting. Patient not taking: Reported on 01/17/2016 08/14/15   Gilda Crease, MD  promethazine (PHENERGAN) 25 MG tablet Take 1 tablet (25 mg total) by mouth every 6 (six) hours as needed for nausea or vomiting. Patient not taking: Reported on 01/17/2016 08/14/15   Gilda Crease, MD  sucralfate (CARAFATE) 1 g tablet Take 1 tablet (1 g total) by mouth 4 (four) times daily -  with meals and at bedtime. Patient not taking: Reported on 01/17/2016 08/29/15   Charlestine Night, PA-C    Family History Family History  Problem Relation Age of Onset  . Diabetes Mother   . Fibromyalgia Mother   . Hypertension Mother   . Diabetes Other     Social History Social History  Substance Use Topics  . Smoking status: Current Every Day Smoker    Packs/day: 0.50    Types: Cigarettes  . Smokeless tobacco: Never Used  . Alcohol use Yes     Comment: minimal      Allergies   Amoxicillin   Review of Systems Review of Systems  Unable to perform ROS: Other     Physical Exam Updated Vital Signs BP 108/78 (BP Location: Left Arm)   Pulse 76   Temp 97.4 F (36.3 C) (Oral)   Resp 24   Ht 5\' 8"  (1.727 m)   Wt 130 lb (59 kg)   SpO2 100%   BMI 19.77 kg/m   Vital signs normal    Physical  Exam  Constitutional: He is oriented to person, place, and time. He appears well-developed and well-nourished.  Non-toxic appearance. He does not appear ill. He appears distressed.  Distress from pain, rolling around on the stretcher, c/o IV hurting in his arm and abdominal pain, having dry heaves  HENT:  Head: Normocephalic and atraumatic.  Right Ear: External ear normal.  Left Ear: External ear normal.  Nose: Nose normal. No mucosal edema or rhinorrhea.  Mouth/Throat: Oropharynx is clear and moist and mucous membranes are normal. No dental abscesses or uvula swelling.  Eyes: Conjunctivae and EOM are normal. Pupils are equal, round, and reactive to light.  Neck:  Normal range of motion and full passive range of motion without pain. Neck supple.  Cardiovascular: Normal rate, regular rhythm and normal heart sounds.  Exam reveals no gallop and no friction rub.   No murmur heard. Pulmonary/Chest: Effort normal and breath sounds normal. No respiratory distress. He has no wheezes. He has no rhonchi. He has no rales. He exhibits no tenderness and no crepitus.  Abdominal: Soft. Normal appearance and bowel sounds are normal. He exhibits no distension. There is tenderness. There is no rebound and no guarding.  Pt is gaging, riding in the bed Pt has diffuse abdominal tenderness  Musculoskeletal: Normal range of motion. He exhibits no edema or tenderness.  Moves all extremities well.  IV in left sub cubital space with redness to surrounding skin    Neurological: He is alert and oriented to person, place, and time. He has normal strength. No cranial nerve deficit.  Skin: Skin is warm, dry and intact. No rash noted. No erythema. No pallor.  Psychiatric: His mood appears anxious. His speech is rapid and/or pressured and delayed. He is agitated.  Nursing note and vitals reviewed.    ED Treatments / Results  DIAGNOSTIC STUDIES: Oxygen Saturation is 100% on RA, normal by my interpretation.     Labs (all labs ordered are listed, but only abnormal results are displayed) Results for orders placed or performed during the hospital encounter of 01/17/16  Lipase, blood  Result Value Ref Range   Lipase 27 11 - 51 U/L  Comprehensive metabolic panel  Result Value Ref Range   Sodium 139 135 - 145 mmol/L   Potassium 3.3 (L) 3.5 - 5.1 mmol/L   Chloride 108 101 - 111 mmol/L   CO2 18 (L) 22 - 32 mmol/L   Glucose, Bld 102 (H) 65 - 99 mg/dL   BUN 13 6 - 20 mg/dL   Creatinine, Ser 1.61 0.61 - 1.24 mg/dL   Calcium 9.3 8.9 - 09.6 mg/dL   Total Protein 7.5 6.5 - 8.1 g/dL   Albumin 4.8 3.5 - 5.0 g/dL   AST 39 15 - 41 U/L   ALT 27 17 - 63 U/L   Alkaline Phosphatase 59 38 -  126 U/L   Total Bilirubin 0.9 0.3 - 1.2 mg/dL   GFR calc non Af Amer >60 >60 mL/min   GFR calc Af Amer >60 >60 mL/min   Anion gap 13 5 - 15  CBC  Result Value Ref Range   WBC 10.4 4.0 - 10.5 K/uL   RBC 4.34 4.22 - 5.81 MIL/uL   Hemoglobin 14.5 13.0 - 17.0 g/dL   HCT 04.5 40.9 - 81.1 %   MCV 93.8 78.0 - 100.0 fL   MCH 33.4 26.0 - 34.0 pg   MCHC 35.6 30.0 - 36.0 g/dL   RDW 91.4 78.2 - 95.6 %   Platelets  214 150 - 400 K/uL   Laboratory interpretation all normal except mild hypokalemia    EKG  EKG Interpretation None       Radiology No results found.  Procedures Procedures (including critical care time)  Medications Ordered in ED Medications  0.9 %  sodium chloride infusion (1,000 mLs Intravenous New Bag/Given 01/17/16 0336)    Followed by  0.9 %  sodium chloride infusion (0 mLs Intravenous Stopped 01/17/16 0735)    Followed by  0.9 %  sodium chloride infusion (not administered)  haloperidol lactate (HALDOL) injection 2 mg (not administered)  ondansetron (ZOFRAN) injection 4 mg (4 mg Intravenous Given 01/17/16 0244)  metoCLOPramide (REGLAN) injection 10 mg (10 mg Intravenous Given 01/17/16 0337)  diphenhydrAMINE (BENADRYL) injection 25 mg (25 mg Intravenous Given 01/17/16 0337)  prochlorperazine (COMPAZINE) injection 10 mg (10 mg Intravenous Given 01/17/16 0533)  LORazepam (ATIVAN) injection 1 mg (1 mg Intravenous Given 01/17/16 0533)  sodium chloride 0.9 % bolus 1,000 mL (1,000 mLs Intravenous New Bag/Given 01/17/16 0725)  iopamidol (ISOVUE-300) 61 % injection 15 mL (15 mLs Oral Contrast Given 01/17/16 0745)     Initial Impression / Assessment and Plan / ED Course  Devoria AlbeIva Kawhi Diebold, MD has reviewed the triage vital signs and the nursing notes.  Pertinent labs & imaging results that were available during my care of the patient were reviewed by me and considered in my medical decision making (see chart for details).  Clinical Course     COORDINATION OF CARE: 3:05 AM-  Pt advised of plan for treatment and pt agrees. Pt will receive lab work for further evaluation. He will also receive IV fluids, Zofran IV, Reglan IV, and Benadryl IV.   5:15 AM Patient was quiet for a while however he started having a lot of gagging again. At this point he was given Compazine and Ativan IV.  6:30 AM patient is quiet  07:15 AM Pt rolling on the stretcher, states "I'm okay", when asked why is is rolling around he states "my stomach is hurting".  AP CT ordered.    08:00 AM left at change of shift with Dr Fleet ContrasB Miller to get results of he AP CT scan  Review prior ED visits shows patient has had several episodes with vomiting and diarrhea and abdominal pain since last summer.  Final Clinical Impressions(s) / ED Diagnoses   Final diagnoses:  Abdominal pain, unspecified abdominal location  Nausea vomiting and diarrhea    Disposition pending  Devoria AlbeIva Alainna Stawicki, MD, FACEP   I personally performed the services described in this documentation, which was scribed in my presence. The recorded information has been reviewed and considered.  Devoria AlbeIva Carlicia Leavens, MD, Concha PyoFACEP    Khristie Sak, MD 01/17/16 0800

## 2016-01-17 NOTE — ED Notes (Signed)
Bed: [id number] Expected date:  Expected time:  Means of arrival:  Comments: Abdominal pain, vomiting

## 2016-04-01 ENCOUNTER — Encounter (HOSPITAL_COMMUNITY): Payer: Self-pay

## 2016-04-01 ENCOUNTER — Emergency Department (HOSPITAL_COMMUNITY)
Admission: EM | Admit: 2016-04-01 | Discharge: 2016-04-01 | Disposition: A | Discharge status: Home / Self Care | Payer: Self-pay | Attending: Emergency Medicine | Admitting: Emergency Medicine

## 2016-04-01 ENCOUNTER — Emergency Department (HOSPITAL_COMMUNITY): Payer: Self-pay

## 2016-04-01 DIAGNOSIS — F17210 Nicotine dependence, cigarettes, uncomplicated: Secondary | ICD-10-CM | POA: Insufficient documentation

## 2016-04-01 DIAGNOSIS — R935 Abnormal findings on diagnostic imaging of other abdominal regions, including retroperitoneum: Secondary | ICD-10-CM | POA: Insufficient documentation

## 2016-04-01 DIAGNOSIS — R109 Unspecified abdominal pain: Secondary | ICD-10-CM | POA: Insufficient documentation

## 2016-04-01 DIAGNOSIS — Z79899 Other long term (current) drug therapy: Secondary | ICD-10-CM | POA: Insufficient documentation

## 2016-04-01 HISTORY — DX: Calculus of kidney: N20.0

## 2016-04-01 LAB — RAPID URINE DRUG SCREEN, HOSP PERFORMED
AMPHETAMINES: NOT DETECTED
Barbiturates: NOT DETECTED
Benzodiazepines: NOT DETECTED
COCAINE: NOT DETECTED
OPIATES: POSITIVE — AB
TETRAHYDROCANNABINOL: POSITIVE — AB

## 2016-04-01 LAB — URINALYSIS, ROUTINE W REFLEX MICROSCOPIC
Bilirubin Urine: NEGATIVE
Glucose, UA: NEGATIVE mg/dL
Hgb urine dipstick: NEGATIVE
Ketones, ur: NEGATIVE mg/dL
Leukocytes, UA: NEGATIVE
Nitrite: NEGATIVE
PH: 9 — AB (ref 5.0–8.0)
Protein, ur: 100 mg/dL — AB
SPECIFIC GRAVITY, URINE: 1.014 (ref 1.005–1.030)

## 2016-04-01 LAB — COMPREHENSIVE METABOLIC PANEL
ALT: 19 U/L (ref 17–63)
AST: 33 U/L (ref 15–41)
Albumin: 4.9 g/dL (ref 3.5–5.0)
Alkaline Phosphatase: 58 U/L (ref 38–126)
Anion gap: 12 (ref 5–15)
BUN: 11 mg/dL (ref 6–20)
CHLORIDE: 105 mmol/L (ref 101–111)
CO2: 24 mmol/L (ref 22–32)
CREATININE: 1.19 mg/dL (ref 0.61–1.24)
Calcium: 10.1 mg/dL (ref 8.9–10.3)
GFR calc Af Amer: >60 mL/min (ref >60)
GLUCOSE: 123 mg/dL — AB (ref 65–99)
Potassium: 3.6 mmol/L (ref 3.5–5.1)
Sodium: 141 mmol/L (ref 135–145)
Total Bilirubin: 0.4 mg/dL (ref 0.3–1.2)
Total Protein: 7.7 g/dL (ref 6.5–8.1)

## 2016-04-01 LAB — CBC
HCT: 42.8 % (ref 39.0–52.0)
Hemoglobin: 14.7 g/dL (ref 13.0–17.0)
MCH: 31.7 pg (ref 26.0–34.0)
MCHC: 34.3 g/dL (ref 30.0–36.0)
MCV: 92.4 fL (ref 78.0–100.0)
PLATELETS: 218 10*3/uL (ref 150–400)
RBC: 4.63 MIL/uL (ref 4.22–5.81)
RDW: 12.3 % (ref 11.5–15.5)
WBC: 5.9 10*3/uL (ref 4.0–10.5)

## 2016-04-01 LAB — LIPASE, BLOOD: LIPASE: 52 U/L — AB (ref 11–51)

## 2016-04-01 MED ORDER — HYDROMORPHONE HCL 1 MG/ML IJ SOLN
1.0000 mg | Freq: Once | INTRAMUSCULAR | Status: AC
  Administered 2016-04-01: 1 mg via INTRAVENOUS
  Filled 2016-04-01: qty 1

## 2016-04-01 MED ORDER — ONDANSETRON HCL 4 MG/2ML IJ SOLN
4.0000 mg | Freq: Once | INTRAMUSCULAR | Status: AC | PRN
  Administered 2016-04-01: 4 mg via INTRAVENOUS
  Filled 2016-04-01: qty 2

## 2016-04-01 MED ORDER — ONDANSETRON HCL 4 MG/2ML IJ SOLN
4.0000 mg | Freq: Once | INTRAMUSCULAR | Status: AC
  Administered 2016-04-01: 4 mg via INTRAVENOUS
  Filled 2016-04-01: qty 2

## 2016-04-01 MED ORDER — HYDROCODONE-ACETAMINOPHEN 5-325 MG PO TABS: 2.0000 | ORAL_TABLET | ORAL | 0 refills | Status: DC | PRN

## 2016-04-01 NOTE — ED Notes (Signed)
Patient given urinal and told specimen needed, rechecked patient and patient currently sleeping.

## 2016-04-01 NOTE — ED Provider Notes (Signed)
[facility]-EMERGENCY DEPT Provider Note   CSN: [id number] Arrival date & time: 04/01/16  0854     History   Chief Complaint Chief Complaint  Patient presents with  . Abdominal Pain  . Flank Pain    HPI Jeffrey Bautista is a 26 y.o. male.  He presents for evaluation of left mid abdomen radiating to left flank pain, typical of his prior kidney stone problem. Last probable kidney stone, October 2017, without follow-up afterwards. He reports only pain and vomiting. He denies dysuria, frequency, fever, chills, weakness or dizziness. There are no other no modifying factors.  HPI  Past Medical History:  Diagnosis Date  . Kidney stone   . Nephrolithiasis 07/2014   left    Patient Active Problem List   Diagnosis Date Noted  . Carpal tunnel syndrome 04/07/2014  . Sciatica of left side 02/03/2012  . Rash 06/04/2011    History reviewed. No pertinent surgical history.     Home Medications    Prior to Admission medications   Medication Sig Start Date End Date Taking? Authorizing Provider  esomeprazole (NEXIUM) 40 MG capsule Take 1 capsule (40 mg total) by mouth daily. Patient not taking: Reported on 04/01/2016 08/29/15   Charlestine Night, PA-C  HYDROcodone-acetaminophen (NORCO/VICODIN) 5-325 MG tablet Take 2 tablets by mouth every 4 (four) hours as needed. Patient not taking: Reported on 04/01/2016 01/17/16   Eber Hong, MD  ibuprofen (ADVIL,MOTRIN) 800 MG tablet Take 1 tablet (800 mg total) by mouth 3 (three) times daily. Patient not taking: Reported on 04/01/2016 01/17/16   Eber Hong, MD  ondansetron (ZOFRAN ODT) 4 MG disintegrating tablet Take 1 tablet (4 mg total) by mouth every 8 (eight) hours as needed for nausea. Patient not taking: Reported on 04/01/2016 01/17/16   Eber Hong, MD  tamsulosin (FLOMAX) 0.4 MG CAPS capsule Take 1 capsule (0.4 mg total) by mouth 2 (two) times daily. Patient not taking: Reported on 04/01/2016 01/17/16   Eber Hong, MD    Family History Family  History  Problem Relation Age of Onset  . Diabetes Mother   . Fibromyalgia Mother   . Hypertension Mother   . Diabetes Other     Social History Social History  Substance Use Topics  . Smoking status: Current Every Day Smoker    Packs/day: 0.50    Types: Cigarettes  . Smokeless tobacco: Never Used  . Alcohol use Yes     Comment: minimal      Allergies   Amoxicillin   Review of Systems Review of Systems  All other systems reviewed and are negative.    Physical Exam Updated Vital Signs BP 122/78 (BP Location: Left Arm)   Pulse 63   Temp 97.3 F (36.3 C) (Oral)   Resp 22   SpO2 100%   Physical Exam  Constitutional: He is oriented to person, place, and time. He appears well-developed and well-nourished. He has a sickly appearance. He appears ill. He appears distressed (He is on his hands and knees, on the gurney actively retching).  HENT:  Head: Normocephalic and atraumatic.  Right Ear: External ear normal.  Left Ear: External ear normal.  Eyes: Conjunctivae and EOM are normal. Pupils are equal, round, and reactive to light.  Neck: Normal range of motion and phonation normal. Neck supple.  Cardiovascular: Normal rate, regular rhythm and normal heart sounds.   Pulmonary/Chest: Effort normal and breath sounds normal. He exhibits no bony tenderness.  Abdominal: Soft. There is tenderness (Mid left abdomen, moderate).  Musculoskeletal:  Normal range of motion.  Neurological: He is alert and oriented to person, place, and time. No cranial nerve deficit or sensory deficit. He exhibits normal muscle tone. Coordination normal.  Skin: Skin is warm, dry and intact.  Psychiatric: He has a normal mood and affect. His behavior is normal. Judgment and thought content normal.  Nursing note and vitals reviewed.    ED Treatments / Results  Labs (all labs ordered are listed, but only abnormal results are displayed) Labs Reviewed  LIPASE, BLOOD - Abnormal; Notable for the  following:       Result Value   Lipase 52 (*)    All other components within normal limits  COMPREHENSIVE METABOLIC PANEL - Abnormal; Notable for the following:    Glucose, Bld 123 (*)    All other components within normal limits  URINALYSIS, ROUTINE W REFLEX MICROSCOPIC - Abnormal; Notable for the following:    APPearance HAZY (*)    pH 9.0 (*)    Protein, ur 100 (*)    Bacteria, UA RARE (*)    Squamous Epithelial / LPF 0-5 (*)    All other components within normal limits  RAPID URINE DRUG SCREEN, HOSP PERFORMED - Abnormal; Notable for the following:    Opiates POSITIVE (*)    Tetrahydrocannabinol POSITIVE (*)    All other components within normal limits  CBC    EKG  EKG Interpretation None       Radiology [place] Renal  Result Date: 04/01/2016 CLINICAL DATA:  Left flank pain for 2 days. Personal history of nephrolithiasis. EXAM: RENAL / URINARY TRACT ULTRASOUND COMPLETE COMPARISON:  None. FINDINGS: Right Kidney: Length: 10.7 cm, within normal limits. Echogenicity within normal limits. No mass or hydronephrosis visualized. Left Kidney: Length: 9.9 cm, within normal limits. Echogenicity within normal limits. No mass or hydronephrosis visualized. Bladder: Appears normal for degree of bladder distention. IMPRESSION: Negative bilateral renal ultrasound. Electronically Signed   By: Marin Robertshristopher  Mattern M.D.   On: 04/01/2016 11:57    Procedures Procedures (including critical care time)  Medications Ordered in ED Medications  ondansetron (ZOFRAN) injection 4 mg (4 mg Intravenous Given 04/01/16 0939)  HYDROmorphone (DILAUDID) injection 1 mg (1 mg Intravenous Given 04/01/16 1008)  ondansetron (ZOFRAN) injection 4 mg (4 mg Intravenous Given 04/01/16 1008)  HYDROmorphone (DILAUDID) injection 1 mg (1 mg Intravenous Given 04/01/16 1251)  ondansetron (ZOFRAN) injection 4 mg (4 mg Intravenous Given 04/01/16 1251)     Initial Impression / Assessment and Plan / ED Course  I have reviewed the triage  vital signs and the nursing notes.  Pertinent labs & imaging results that were available during my care of the patient were reviewed by me and considered in my medical decision making (see chart for details).  Clinical Course     Medications  ondansetron (ZOFRAN) injection 4 mg (4 mg Intravenous Given 04/01/16 0939)  HYDROmorphone (DILAUDID) injection 1 mg (1 mg Intravenous Given 04/01/16 1008)  ondansetron (ZOFRAN) injection 4 mg (4 mg Intravenous Given 04/01/16 1008)  HYDROmorphone (DILAUDID) injection 1 mg (1 mg Intravenous Given 04/01/16 1251)  ondansetron (ZOFRAN) injection 4 mg (4 mg Intravenous Given 04/01/16 1251)    Patient Vitals for the past 24 hrs:  BP Temp Temp src Pulse Resp SpO2  04/01/16 0858 122/78 97.3 F (36.3 C) Oral 63 22 100 %    1:05 PM Reevaluation with update and discussion. After initial assessment and treatment, an updated evaluation reveals He is more comfortable this time, but wants to wait another  30 minutes to see how he does. Solene Hereford L    13:55- he is comfortable at this time, and states that he feels like he can go home. Findings discussed with the patient and all questions were answered  Final Clinical Impressions(s) / ED Diagnoses   Final diagnoses:  Left flank pain   Nonspecific abdominal pain, with history of kidney stones. Imaged with ultrasound today did not reveal hydronephrosis. Abnormal urinalysis, but not specifically diagnostic for infection. Urine culture is sent.   Nursing Notes Reviewed/ Care Coordinated Applicable Imaging Reviewed Interpretation of Laboratory Data incorporated into ED treatment  The patient appears reasonably screened and/or stabilized for discharge and I doubt any other medical condition or other [facility] requiring further screening, evaluation, or treatment in the ED at this time prior to discharge.  Plan: Home Medications- continue; Home Treatments- rest; return here if the recommended treatment, does not improve the  symptoms; Recommended follow up- Urology in 1 week   New Prescriptions New Prescriptions   No medications on file     Mancel Bale, MD 04/01/16 1401

## 2016-04-01 NOTE — ED Notes (Signed)
Bed: WA14 Expected date:  Expected time:  Means of arrival:  Comments: EMS-abdominal pain 

## 2016-04-01 NOTE — Discharge Instructions (Signed)
There is no clear evidence today that you have a problem with kidney stone or urinary tract infection. Your urinalysis is somewhat abnormal and we have sent a urine culture to see if it shows infection.  Try to drink plenty of fluids and eat 3 regular meals each day.  Return here, if needed, for problems.

## 2016-04-01 NOTE — ED Triage Notes (Signed)
Per EMS, pt from home.  Pt dx with kidney stone on 10/25.  Pt having abdominal pain with n/v starting today.  Pt states some groin pain also.  Vitals: 122/78, hr 78, resp 20, 100% ra

## 2016-04-02 LAB — URINE CULTURE: Culture: NO GROWTH

## 2016-04-17 IMAGING — CT CT RENAL STONE PROTOCOL
2 of 3 series · 16 of 38 positions shown, 18 images · non-contrast
Comparison: CT abdomen pelvis 11/02/2014

CLINICAL DATA: Patient recently treated for renal stone now with
3-4 hours left lower abdominal pain, nausea and vomiting.

EXAM:
CT ABDOMEN AND PELVIS WITHOUT CONTRAST
TECHNIQUE: Multidetector CT imaging of the abdomen and pelvis was performed
following the standard protocol without IV contrast.

[Series 3: coronal · coronal · 0.64mm/px · 3 of 63 slices shown]
[im 21/63  soft-tissue]
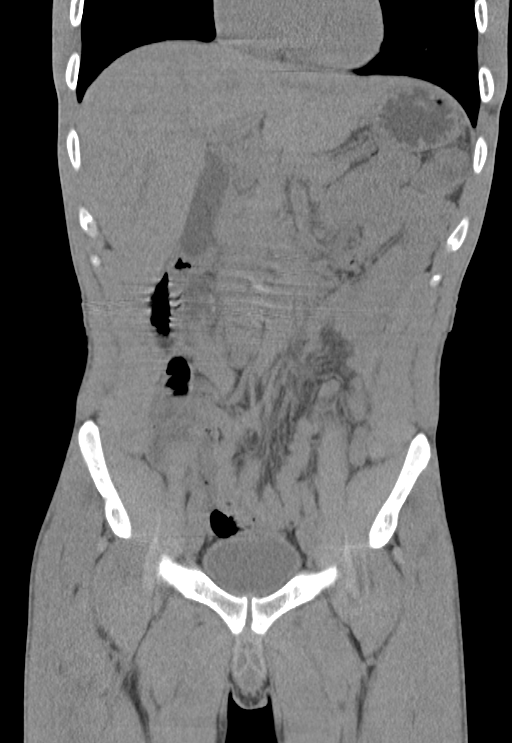
[im 28/63  soft-tissue]
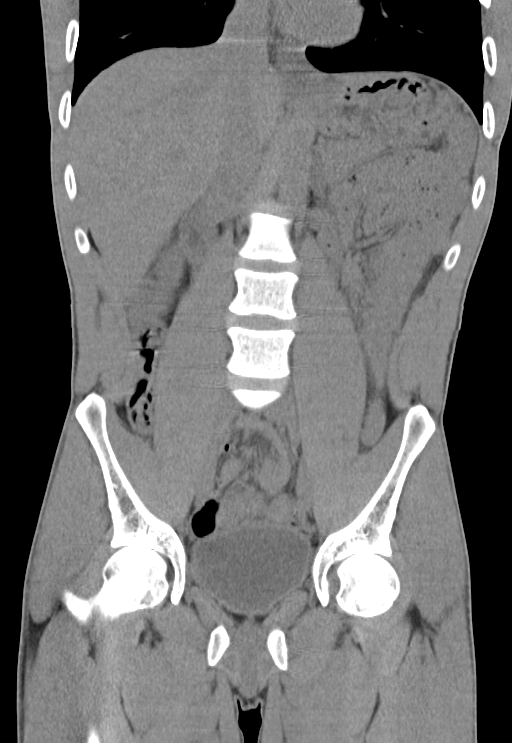
[im 35/63  soft-tissue]
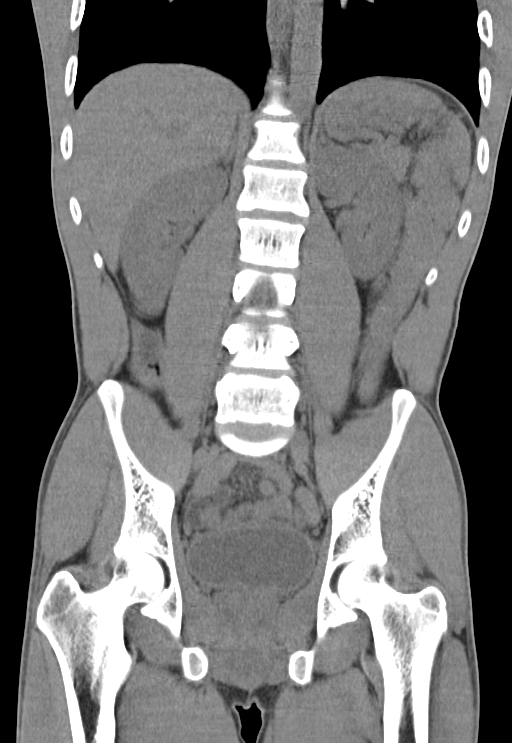

[Series 6: lung · axial · 0.78mm/px · z∈[-662,-557]mm · 13 of 24 slices shown, 15 images]
[im 2/24  soft-tissue]
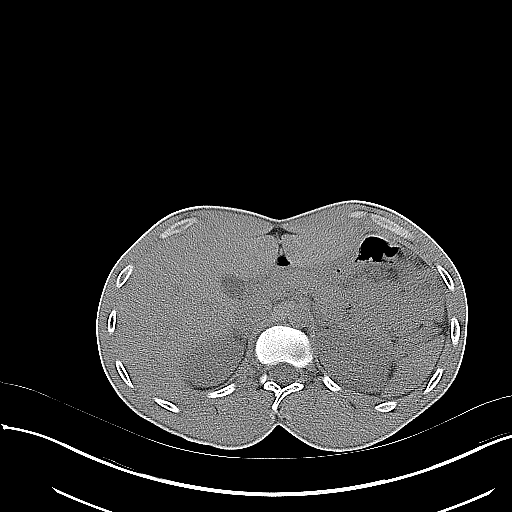
[im 2/24  bone]
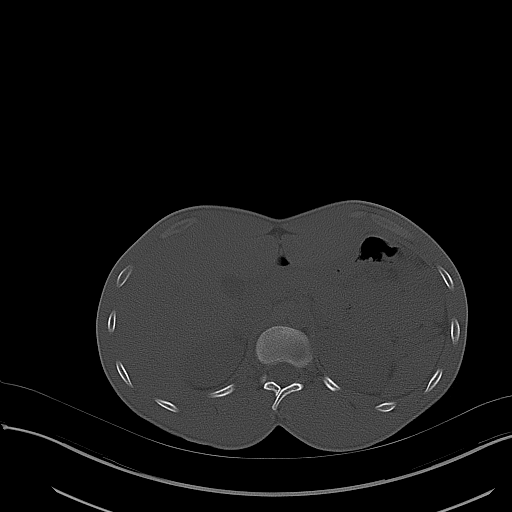
[im 4/24  soft-tissue]
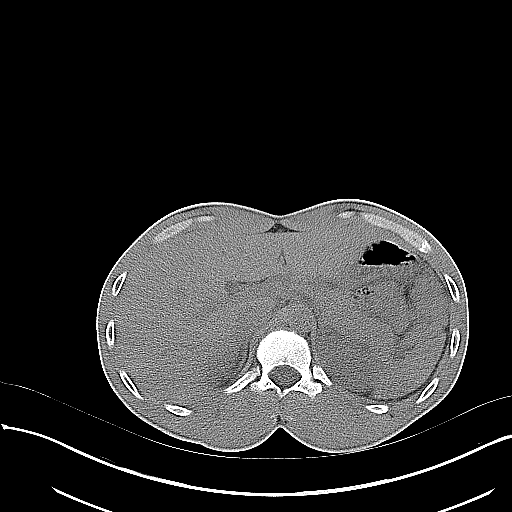
[im 6/24  soft-tissue]
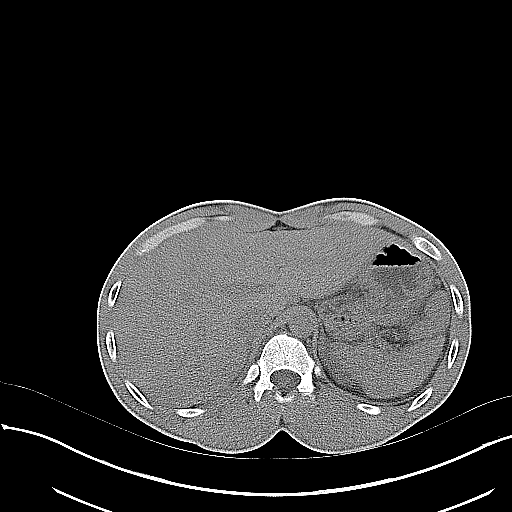
[im 7/24  soft-tissue]
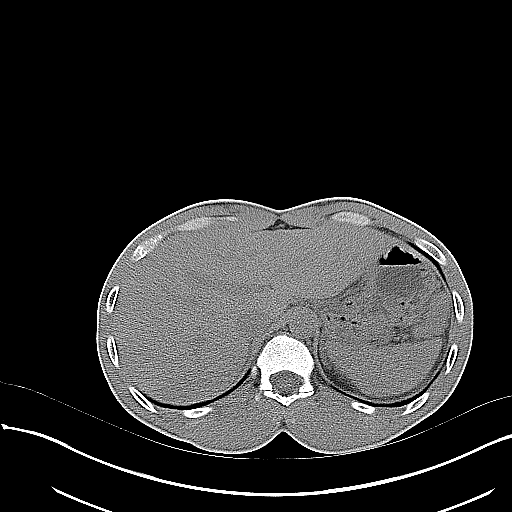
[im 9/24  soft-tissue]
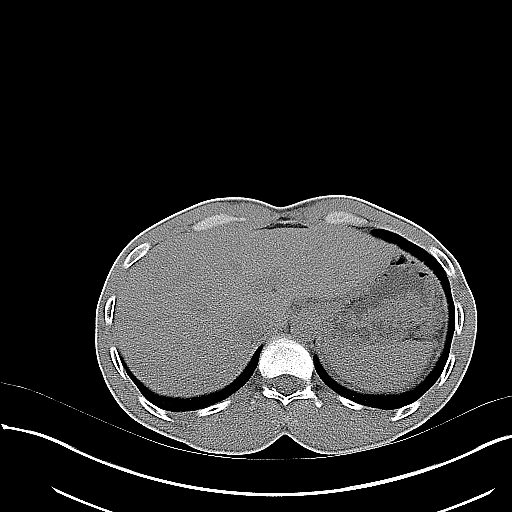
[im 11/24  soft-tissue]
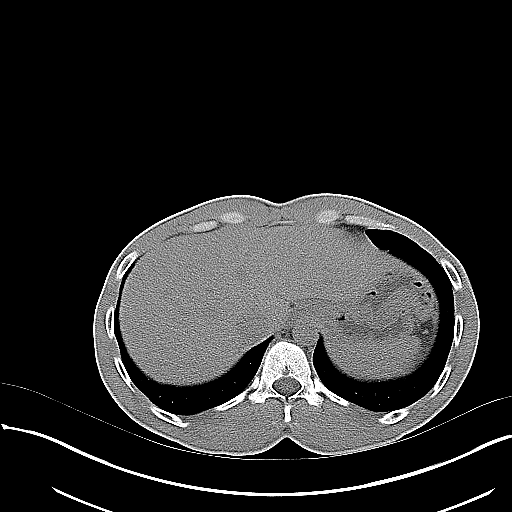
[im 13/24  soft-tissue]
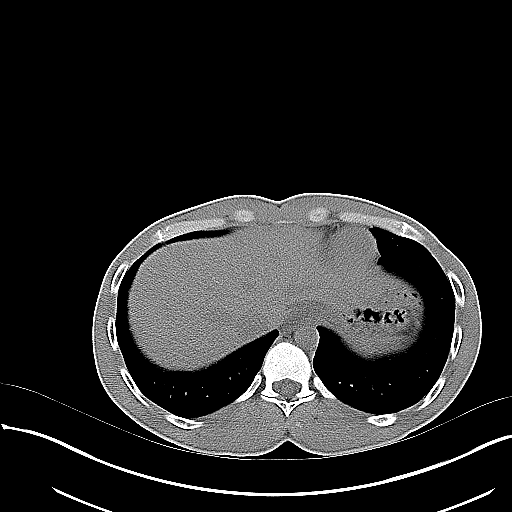
[im 14/24  soft-tissue]
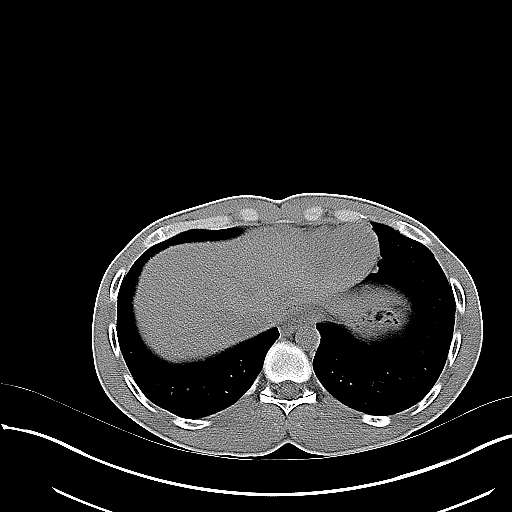
[im 16/24  soft-tissue]
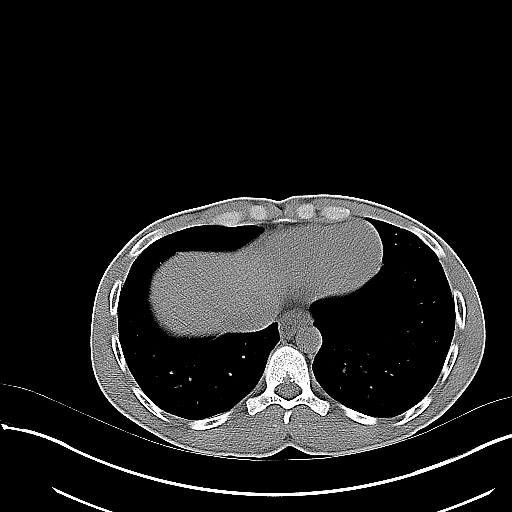
[im 16/24  bone]
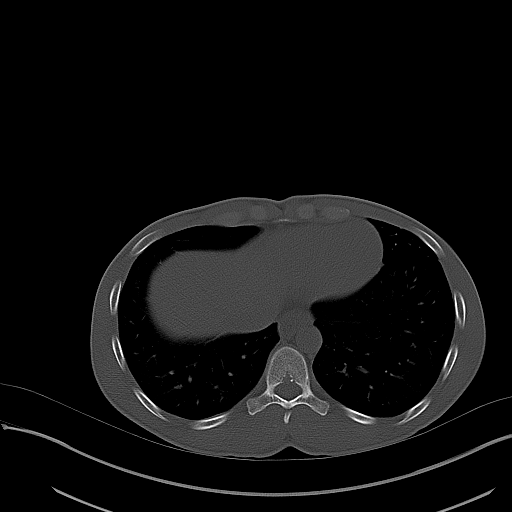
[im 18/24  soft-tissue]
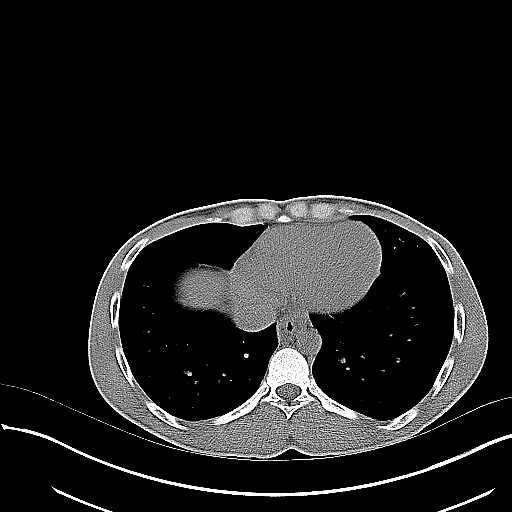
[im 19/24  soft-tissue]
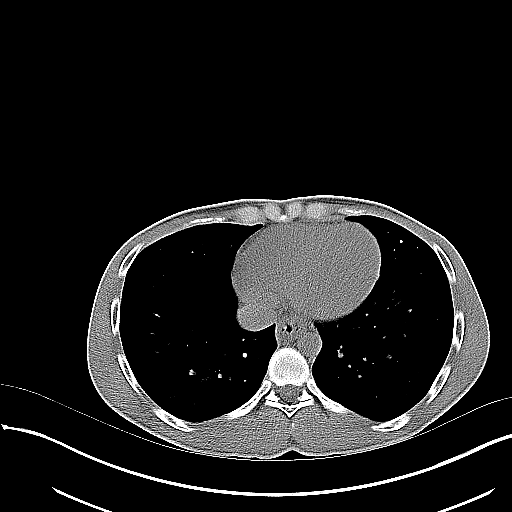
[im 21/24  soft-tissue]
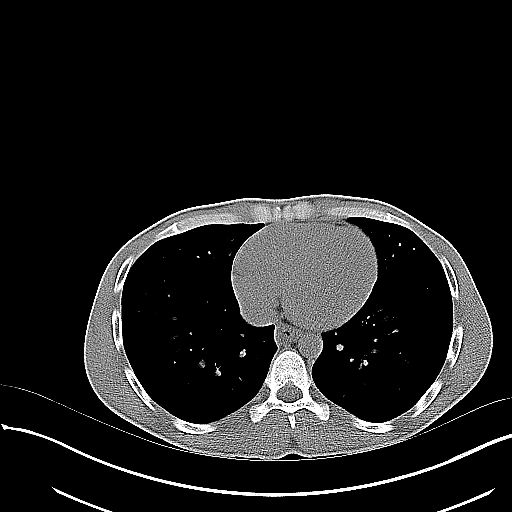
[im 23/24  soft-tissue]
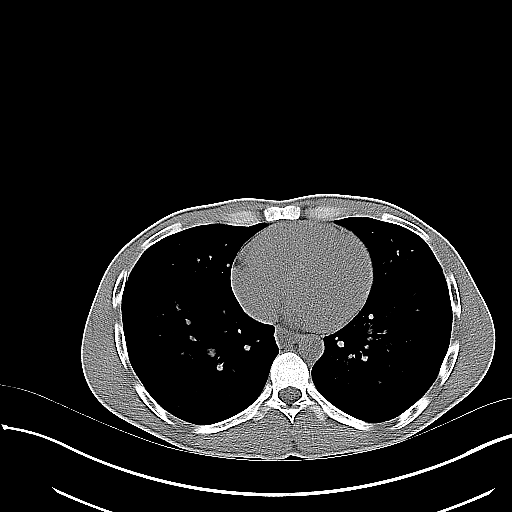

[16 of 38 positions shown; findings below may reference images not displayed]

FINDINGS: Lower chest:  No consolidative or nodular pulmonary opacities.

Hepatobiliary: Liver is normal in size and contour. Gallbladder is
unremarkable.

Pancreas: Unremarkable

Spleen:  Unremarkable

Adrenals/Urinary Tract: There is a 2 mm nonobstructing stone within
the inferior pole of the right kidney (image 37; series 3). There is
a 3 mm nonobstructing stone superior pole left kidney. No
ureterolithiasis. Urinary bladder is unremarkable.

Stomach/Bowel: No evidence for bowel obstruction. No significant
bowel wall thickening. Normal appendix.

Vascular/Lymphatic: Normal caliber abdominal aorta. No
retroperitoneal lymphadenopathy.

Other: None

Musculoskeletal: No aggressive or acute appearing osseous lesions.
IMPRESSION: Bilateral nonobstructing nephrolithiasis.

No hydronephrosis or hydroureter.  No definite ureterolithiasis.

## 2017-11-30 ENCOUNTER — Other Ambulatory Visit: Payer: Self-pay

## 2017-11-30 ENCOUNTER — Emergency Department (HOSPITAL_COMMUNITY): Payer: BC Managed Care – PPO

## 2017-11-30 ENCOUNTER — Encounter (HOSPITAL_COMMUNITY): Payer: Self-pay | Admitting: *Deleted

## 2017-11-30 ENCOUNTER — Emergency Department (HOSPITAL_COMMUNITY)
Admission: EM | Admit: 2017-11-30 | Discharge: 2017-11-30 | Disposition: A | Discharge status: Home / Self Care | Payer: BC Managed Care – PPO | Attending: Emergency Medicine | Admitting: Emergency Medicine

## 2017-11-30 DIAGNOSIS — F17210 Nicotine dependence, cigarettes, uncomplicated: Secondary | ICD-10-CM | POA: Insufficient documentation

## 2017-11-30 DIAGNOSIS — Z79899 Other long term (current) drug therapy: Secondary | ICD-10-CM | POA: Insufficient documentation

## 2017-11-30 DIAGNOSIS — K2970 Gastritis, unspecified, without bleeding: Secondary | ICD-10-CM

## 2017-11-30 LAB — COMPREHENSIVE METABOLIC PANEL
ALBUMIN: 4.4 g/dL (ref 3.5–5.0)
ALK PHOS: 49 U/L (ref 38–126)
ALT: 28 U/L (ref 0–44)
ANION GAP: 12 (ref 5–15)
AST: 35 U/L (ref 15–41)
BILIRUBIN TOTAL: 0.7 mg/dL (ref 0.3–1.2)
BUN: 9 mg/dL (ref 6–20)
CALCIUM: 9.2 mg/dL (ref 8.9–10.3)
CO2: 25 mmol/L (ref 22–32)
Chloride: 107 mmol/L (ref 98–111)
Creatinine, Ser: 1.01 mg/dL (ref 0.61–1.24)
GFR calc non Af Amer: >60 mL/min (ref >60)
GLUCOSE: 113 mg/dL — AB (ref 70–99)
Potassium: 3.6 mmol/L (ref 3.5–5.1)
Sodium: 144 mmol/L (ref 135–145)
Total Protein: 7.2 g/dL (ref 6.5–8.1)

## 2017-11-30 LAB — CBC
HCT: 43.4 % (ref 39.0–52.0)
Hemoglobin: 15.2 g/dL (ref 13.0–17.0)
MCH: 33.5 pg (ref 26.0–34.0)
MCHC: 35 g/dL (ref 30.0–36.0)
MCV: 95.6 fL (ref 78.0–100.0)
Platelets: 213 10*3/uL (ref 150–400)
RBC: 4.54 MIL/uL (ref 4.22–5.81)
RDW: 13.1 % (ref 11.5–15.5)
WBC: 16.3 10*3/uL — ABNORMAL HIGH (ref 4.0–10.5)

## 2017-11-30 LAB — LIPASE, BLOOD: Lipase: 59 U/L — ABNORMAL HIGH (ref 11–51)

## 2017-11-30 MED ORDER — DICYCLOMINE HCL 20 MG PO TABS: 20.0000 mg | ORAL_TABLET | Freq: Two times a day (BID) | ORAL | 0 refills | Status: DC

## 2017-11-30 MED ORDER — SODIUM CHLORIDE 0.9 % IJ SOLN
INTRAMUSCULAR | Status: AC
  Administered 2017-11-30: 06:00:00
  Filled 2017-11-30: qty 50

## 2017-11-30 MED ORDER — ONDANSETRON 4 MG PO TBDP: 4.0000 mg | ORAL_TABLET | Freq: Three times a day (TID) | ORAL | 0 refills | Status: DC | PRN

## 2017-11-30 MED ORDER — FAMOTIDINE 20 MG PO TABS: 20.0000 mg | ORAL_TABLET | Freq: Two times a day (BID) | ORAL | 0 refills | Status: DC

## 2017-11-30 MED ORDER — PROMETHAZINE HCL 25 MG/ML IJ SOLN
25.0000 mg | Freq: Once | INTRAMUSCULAR | Status: AC
  Administered 2017-11-30: 25 mg via INTRAVENOUS
  Filled 2017-11-30: qty 1

## 2017-11-30 MED ORDER — IOPAMIDOL (ISOVUE-300) INJECTION 61%
100.0000 mL | Freq: Once | INTRAVENOUS | Status: AC | PRN
  Administered 2017-11-30: 100 mL via INTRAVENOUS

## 2017-11-30 MED ORDER — IOPAMIDOL (ISOVUE-300) INJECTION 61%
INTRAVENOUS | Status: AC
  Administered 2017-11-30: 100 mL via INTRAVENOUS
  Filled 2017-11-30: qty 100

## 2017-11-30 MED ORDER — FAMOTIDINE IN NACL 20-0.9 MG/50ML-% IV SOLN
20.0000 mg | Freq: Once | INTRAVENOUS | Status: AC
  Administered 2017-11-30: 20 mg via INTRAVENOUS
  Filled 2017-11-30: qty 50

## 2017-11-30 MED ORDER — MORPHINE SULFATE (PF) 2 MG/ML IV SOLN
2.0000 mg | Freq: Once | INTRAVENOUS | Status: AC
  Administered 2017-11-30: 2 mg via INTRAVENOUS
  Filled 2017-11-30: qty 1

## 2017-11-30 MED ORDER — SODIUM CHLORIDE 0.9 % IV BOLUS
1000.0000 mL | Freq: Once | INTRAVENOUS | Status: AC
  Administered 2017-11-30: 1000 mL via INTRAVENOUS

## 2017-11-30 MED ORDER — HALOPERIDOL LACTATE 5 MG/ML IJ SOLN
2.0000 mg | Freq: Once | INTRAMUSCULAR | Status: AC
  Administered 2017-11-30: 2 mg via INTRAVENOUS
  Filled 2017-11-30: qty 1

## 2017-11-30 NOTE — ED Provider Notes (Signed)
[facility]-EMERGENCY DEPT Provider Note   CSN: [id number] Arrival date & time: 11/30/17  0329     History   Chief Complaint Chief Complaint  Patient presents with  . Emesis    HPI Jeffrey Bautista is a 27 y.o. male with a past medical history of kidney stones, who presents to ED for evaluation of a 5-hour history of left lower quadrant, right lower quadrant abdominal pain, nausea, vomiting, diarrhea. Pain is sharp and rated as 10/10. Begins in LLQ and radiates to RLQ. He states that he has vomited about 7 times since the symptoms began.  He ate 2 slices of pizza 2 hours before symptoms began.  No sick contacts with similar symptoms.  Denies any urinary symptoms, fevers, recent travel, hematemesis.  He denies any alcohol, tobacco or other drug use.  Denies prior abdominal surgeries.  HPI  Past Medical History:  Diagnosis Date  . Kidney stone   . Nephrolithiasis 07/2014   left    Patient Active Problem List   Diagnosis Date Noted  . Carpal tunnel syndrome 04/07/2014  . Sciatica of left side 02/03/2012  . Rash 06/04/2011    History reviewed. No pertinent surgical history.      Home Medications    Prior to Admission medications   Medication Sig Start Date End Date Taking? Authorizing Provider  dicyclomine (BENTYL) 20 MG tablet Take 1 tablet (20 mg total) by mouth 2 (two) times daily. 11/30/17   Makila Colombe, PA-C  esomeprazole (NEXIUM) 40 MG capsule Take 1 capsule (40 mg total) by mouth daily. Patient not taking: Reported on 04/01/2016 08/29/15   Charlestine Night, PA-C  famotidine (PEPCID) 20 MG tablet Take 1 tablet (20 mg total) by mouth 2 (two) times daily. 11/30/17   Hallie Ishida, PA-C  HYDROcodone-acetaminophen (NORCO) 5-325 MG tablet Take 2 tablets by mouth every 4 (four) hours as needed. 04/01/16   Mancel Bale, MD  ondansetron (ZOFRAN ODT) 4 MG disintegrating tablet Take 1 tablet (4 mg total) by mouth every 8 (eight) hours as needed for nausea or  vomiting. 11/30/17   Jaylin Benzel, PA-C  tamsulosin (FLOMAX) 0.4 MG CAPS capsule Take 1 capsule (0.4 mg total) by mouth 2 (two) times daily. Patient not taking: Reported on 04/01/2016 01/17/16   Eber Hong, MD    Family History Family History  Problem Relation Age of Onset  . Diabetes Mother   . Fibromyalgia Mother   . Hypertension Mother   . Diabetes Other     Social History Social History   Tobacco Use  . Smoking status: Current Every Day Smoker    Packs/day: 0.50    Types: Cigarettes  . Smokeless tobacco: Never Used  Substance Use Topics  . Alcohol use: Yes    Comment: minimal   . Drug use: No     Allergies   Amoxicillin   Review of Systems Review of Systems  Constitutional: Negative for appetite change, chills and fever.  HENT: Negative for ear pain, rhinorrhea, sneezing and sore throat.   Eyes: Negative for photophobia and visual disturbance.  Respiratory: Negative for cough, chest tightness, shortness of breath and wheezing.   Cardiovascular: Negative for chest pain and palpitations.  Gastrointestinal: Positive for abdominal pain, diarrhea, nausea and vomiting. Negative for blood in stool and constipation.  Genitourinary: Negative for dysuria, hematuria and urgency.  Musculoskeletal: Negative for myalgias.  Skin: Negative for rash.  Neurological: Negative for dizziness, weakness and light-headedness.     Physical Exam Updated Vital Signs  BP 101/72   Pulse 71   Resp 20   SpO2 100%   Physical Exam  Constitutional: He appears well-developed and well-nourished. No distress.  Uncomfortable, retching.  HENT:  Head: Normocephalic and atraumatic.  Nose: Nose normal.  Eyes: Conjunctivae and EOM are normal. Left eye exhibits no discharge. No scleral icterus.  Neck: Normal range of motion. Neck supple.  Cardiovascular: Normal rate, regular rhythm, normal heart sounds and intact distal pulses. Exam reveals no gallop and no friction rub.  No murmur  heard. Pulmonary/Chest: Effort normal and breath sounds normal. No respiratory distress.  Abdominal: Soft. Bowel sounds are normal. He exhibits no distension. There is tenderness (Left lower quadrant and right lower quadrant). There is no guarding.  Musculoskeletal: Normal range of motion. He exhibits no edema.  Neurological: He is alert. He exhibits normal muscle tone. Coordination normal.  Skin: Skin is warm and dry. No rash noted.  Psychiatric: He has a normal mood and affect.  Nursing note and vitals reviewed.    ED Treatments / Results  Labs (all labs ordered are listed, but only abnormal results are displayed) Labs Reviewed  LIPASE, BLOOD - Abnormal; Notable for the following components:      Result Value   Lipase 59 (*)    All other components within normal limits  COMPREHENSIVE METABOLIC PANEL - Abnormal; Notable for the following components:   Glucose, Bld 113 (*)    All other components within normal limits  CBC - Abnormal; Notable for the following components:   WBC 16.3 (*)    All other components within normal limits    EKG None  Radiology Ct Abdomen Pelvis W Contrast  Result Date: 11/30/2017 CLINICAL DATA:  Abdominal pain EXAM: CT ABDOMEN AND PELVIS WITH CONTRAST TECHNIQUE: Multidetector CT imaging of the abdomen and pelvis was performed using the standard protocol following bolus administration of intravenous contrast. CONTRAST:  ISOVUE-300 IOPAMIDOL (ISOVUE-300) INJECTION 61% COMPARISON:  CT abdomen pelvis 01/17/2016 FINDINGS: LOWER CHEST: There is no basilar pleural or apical pericardial effusion. HEPATOBILIARY: The hepatic contours and density are normal. There is no intra- or extrahepatic biliary dilatation. The gallbladder is normal. PANCREAS: The pancreatic parenchymal contours are normal and there is no ductal dilatation. There is no peripancreatic fluid collection. SPLEEN: Normal. ADRENALS/URINARY TRACT: --Adrenal glands: Normal. --Right kidney/ureter: No  hydronephrosis, nephroureterolithiasis, perinephric stranding or solid renal mass. --Left kidney/ureter: No hydronephrosis, nephroureterolithiasis, perinephric stranding or solid renal mass. --Urinary bladder: Normal for degree of distention STOMACH/BOWEL: --Stomach/Duodenum: There is no hiatal hernia or other gastric abnormality. The duodenal course and caliber are normal. --Small bowel: No dilatation or inflammation. --Colon: No focal abnormality. --Appendix: Normal. VASCULAR/LYMPHATIC: Normal course and caliber of the major abdominal vessels. No abdominal or pelvic lymphadenopathy. REPRODUCTIVE: Normal prostate size with symmetric seminal vesicles. MUSCULOSKELETAL. No bony spinal canal stenosis or focal osseous abnormality. OTHER: None. IMPRESSION: No acute abnormality of the abdomen or pelvis. Electronically Signed   By: Deatra Robinson M.D.   On: 11/30/2017 06:13    Procedures Procedures (including critical care time)  Medications Ordered in ED Medications  sodium chloride 0.9 % bolus 1,000 mL (0 mLs Intravenous Stopped 11/30/17 0636)  promethazine (PHENERGAN) injection 25 mg (25 mg Intravenous Given 11/30/17 0516)  morphine 2 MG/ML injection 2 mg (2 mg Intravenous Given 11/30/17 0517)  iopamidol (ISOVUE-300) 61 % injection 100 mL (100 mLs Intravenous Contrast Given 11/30/17 0541)  sodium chloride 0.9 % injection (  Given by Other 11/30/17 0622)  haloperidol lactate (HALDOL) injection  2 mg (2 mg Intravenous Given 11/30/17 0635)  famotidine (PEPCID) IVPB 20 mg premix (20 mg Intravenous New Bag/Given 11/30/17 0636)     Initial Impression / Assessment and Plan / ED Course  I have reviewed the triage vital signs and the nursing notes.  Pertinent labs & imaging results that were available during my care of the patient were reviewed by me and considered in my medical decision making (see chart for details).  Clinical Course as of Nov 30 708  Wynelle Link Nov 30, 2017  0708 Oral temp 98.40F.   [HK]    Clinical  Course User Index [HK] Dietrich Pates, PA-C    27 year old male with past medical history of kidney stones presents to ED for evaluation of 5-hour history of left lower quadrant, right lower quadrant abdominal pain, nausea, vomiting diarrhea.  Symptoms began after he ate pizza for dinner last night.  No sick contacts with similar symptoms.  On exam there is tenderness palpation of the lower abdomen without rebound or guarding noted.  Patient appears uncomfortable and heaving on exam.  Liquid emesis noted in emesis bag.  Lab work significant for leukocytosis at 16, lipase mildly elevated at 59.  Urinalysis and UDS pending.  Will obtain CT his abdomen pelvis to rule out emergent cause of symptoms.  CT of the abdomen pelvis is negative for acute abnormality.  I went to check up on patient and he states that he was feeling better.  However, at the end of my exam he began dry heaving again.  Will order Haldol, Pepcid and reassess.  Will await urine.  On reexamination abdomen is soft, nontender nondistended.  Patient resting comfortably.  Patient urinated in the bathroom but did not collect the specimen cup.  He denies any urinary symptoms.  Do not feel that urinalysis necessary at this time.  I suspect his symptoms are due to gastritis.  Leukocytosis is most likely reactive.  Will give antiemetics, antispasmodics and Pepcid.  Advised to return to ED for any severe worsening symptoms.  Portions of this note were generated with Scientist, clinical (histocompatibility and immunogenetics). Dictation errors may occur despite best attempts at proofreading.   Final Clinical Impressions(s) / ED Diagnoses   Final diagnoses:  Gastritis without bleeding, unspecified chronicity, unspecified gastritis type    ED Discharge Orders         Ordered    ondansetron (ZOFRAN ODT) 4 MG disintegrating tablet  Every 8 hours PRN     11/30/17 0709    famotidine (PEPCID) 20 MG tablet  2 times daily     11/30/17 0709    dicyclomine (BENTYL) 20 MG tablet  2  times daily     11/30/17 0709           Dietrich Pates, PA-C 11/30/17 0710    Derwood Kaplan, MD 11/30/17 [phone]

## 2017-11-30 NOTE — ED Triage Notes (Signed)
Pt reports n/v/d and now having LLQ pain. Dry heaves in triage.

## 2017-11-30 NOTE — Discharge Instructions (Signed)
Return to ED for worsening symptoms, severe abdominal pain or chest pain, vomiting or coughing up blood.

## 2017-11-30 NOTE — ED Triage Notes (Signed)
Pt arrives via EMS from home. Per their report he has had sudden onset n/v/d about 0030 tonight. Pt had pizza for dinner, no one else in the family is sick. He has LUQ tenderness. IV established, approx 700 NS was administered. Vitals hr 62 (pt had periods of HR 40-50's), 122/76, R 14, 99% RA, CBG 121.

## 2018-06-24 ENCOUNTER — Emergency Department (HOSPITAL_COMMUNITY)
Admission: EM | Admit: 2018-06-24 | Discharge: 2018-06-24 | Disposition: A | Discharge status: Home / Self Care | Payer: Self-pay | Attending: Emergency Medicine | Admitting: Emergency Medicine

## 2018-06-24 ENCOUNTER — Encounter (HOSPITAL_COMMUNITY): Payer: Self-pay

## 2018-06-24 ENCOUNTER — Other Ambulatory Visit: Payer: Self-pay

## 2018-06-24 DIAGNOSIS — F17210 Nicotine dependence, cigarettes, uncomplicated: Secondary | ICD-10-CM | POA: Insufficient documentation

## 2018-06-24 DIAGNOSIS — R112 Nausea with vomiting, unspecified: Secondary | ICD-10-CM | POA: Insufficient documentation

## 2018-06-24 DIAGNOSIS — Z79899 Other long term (current) drug therapy: Secondary | ICD-10-CM | POA: Insufficient documentation

## 2018-06-24 DIAGNOSIS — R197 Diarrhea, unspecified: Secondary | ICD-10-CM | POA: Insufficient documentation

## 2018-06-24 DIAGNOSIS — R1084 Generalized abdominal pain: Secondary | ICD-10-CM | POA: Insufficient documentation

## 2018-06-24 LAB — CBC WITH DIFFERENTIAL/PLATELET
Abs Immature Granulocytes: 0.06 10*3/uL (ref 0.00–0.07)
Basophils Absolute: 0.1 10*3/uL (ref 0.0–0.1)
Basophils Relative: 0 %
Eosinophils Absolute: 0 10*3/uL (ref 0.0–0.5)
Eosinophils Relative: 0 %
HCT: 44.7 % (ref 39.0–52.0)
Hemoglobin: 15.4 g/dL (ref 13.0–17.0)
Immature Granulocytes: 0 %
Lymphocytes Relative: 8 %
Lymphs Abs: 1.2 10*3/uL (ref 0.7–4.0)
MCH: 33.9 pg (ref 26.0–34.0)
MCHC: 34.5 g/dL (ref 30.0–36.0)
MCV: 98.5 fL (ref 80.0–100.0)
Monocytes Absolute: 0.8 10*3/uL (ref 0.1–1.0)
Monocytes Relative: 5 %
Neutro Abs: 12.5 10*3/uL — ABNORMAL HIGH (ref 1.7–7.7)
Neutrophils Relative %: 87 %
Platelets: 206 10*3/uL (ref 150–400)
RBC: 4.54 MIL/uL (ref 4.22–5.81)
RDW: 12.2 % (ref 11.5–15.5)
WBC: 14.6 10*3/uL — ABNORMAL HIGH (ref 4.0–10.5)
nRBC: 0 % (ref 0.0–0.2)

## 2018-06-24 LAB — COMPREHENSIVE METABOLIC PANEL
ALT: 28 U/L (ref 0–44)
AST: 31 U/L (ref 15–41)
Albumin: 5.2 g/dL — ABNORMAL HIGH (ref 3.5–5.0)
Alkaline Phosphatase: 54 U/L (ref 38–126)
Anion gap: 10 (ref 5–15)
BUN: 13 mg/dL (ref 6–20)
CO2: 23 mmol/L (ref 22–32)
Calcium: 9.7 mg/dL (ref 8.9–10.3)
Chloride: 107 mmol/L (ref 98–111)
Creatinine, Ser: 0.99 mg/dL (ref 0.61–1.24)
GFR calc Af Amer: >60 mL/min (ref >60)
GFR calc non Af Amer: >60 mL/min (ref >60)
Glucose, Bld: 122 mg/dL — ABNORMAL HIGH (ref 70–99)
Potassium: 4.2 mmol/L (ref 3.5–5.1)
Sodium: 140 mmol/L (ref 135–145)
Total Bilirubin: 0.9 mg/dL (ref 0.3–1.2)
Total Protein: 8.2 g/dL — ABNORMAL HIGH (ref 6.5–8.1)

## 2018-06-24 LAB — MAGNESIUM: Magnesium: 2 mg/dL (ref 1.7–2.4)

## 2018-06-24 MED ORDER — ONDANSETRON 8 MG PO TBDP: 8.0000 mg | ORAL_TABLET | Freq: Three times a day (TID) | ORAL | 0 refills | Status: DC | PRN

## 2018-06-24 MED ORDER — ONDANSETRON HCL 4 MG/2ML IJ SOLN
4.0000 mg | Freq: Once | INTRAMUSCULAR | Status: AC
  Administered 2018-06-24: 19:00:00 4 mg via INTRAVENOUS
  Filled 2018-06-24: qty 2

## 2018-06-24 MED ORDER — LOPERAMIDE HCL 2 MG PO CAPS: 4.0000 mg | ORAL_CAPSULE | Freq: Every evening | ORAL | 0 refills | Status: DC | PRN

## 2018-06-24 MED ORDER — PROMETHAZINE HCL 25 MG RE SUPP: 25.0000 mg | Freq: Four times a day (QID) | RECTAL | 0 refills | Status: DC | PRN

## 2018-06-24 MED ORDER — HALOPERIDOL LACTATE 5 MG/ML IJ SOLN
5.0000 mg | Freq: Once | INTRAMUSCULAR | Status: AC
  Administered 2018-06-24: 5 mg via INTRAVENOUS
  Filled 2018-06-24: qty 1

## 2018-06-24 MED ORDER — LACTATED RINGERS IV BOLUS
1000.0000 mL | Freq: Once | INTRAVENOUS | Status: AC
  Administered 2018-06-24: 1000 mL via INTRAVENOUS

## 2018-06-24 NOTE — ED Triage Notes (Signed)
EMS from home, Abdominal pain, nausea and cramping since 1200. Second time EMS called for same Pt refused first call.  BP 114/76 HR 58 RR 20 Sp02 100 RA  22 RAC

## 2018-06-24 NOTE — ED Notes (Signed)
Bed: WA08 Expected date:  Expected time:  Means of arrival:  Comments: EMS/abd. Pain/n/v

## 2018-06-24 NOTE — Discharge Instructions (Addendum)
You are seen in the ER for vomiting and diarrhea.  Labs in the ER reassuring and we were able to get your symptoms in better control. Please take the medications prescribed for symptom control.  Diarrhea medicine should be only taken for nighttime symptoms.  Return to the ER immediately if you start having inability to keep medications down, dizziness, near fainting, high fevers or bloody stools.

## 2018-06-24 NOTE — ED Provider Notes (Signed)
[facility]-EMERGENCY DEPT Provider Note   CSN: [id number] Arrival date & time: 06/24/18  1748    History   Chief Complaint Chief Complaint  Patient presents with  . Abdominal Pain  . Nausea    HPI Jeffrey Bautista is a 28 y.o. male.     HPI  28 year old male comes to the ER with chief complaint of abdominal pain, nausea, vomiting, diarrhea. He has known history of kidney stones.  He denies any surgical history or substance abuse history.  Patient reports that he woke up around 1130 and started having vomiting, diarrhea.  He has had about 20+ episodes of emesis and 10+ episodes of diarrhea.  His vomitus and stools have been nonbloody, and the emesis has been bilious.  Patient is having some crampy abdominal pain that is generalized.  He is passing flatus.  He denies any history of similar symptoms in the past, sick contacts, exposures to any contaminated food.  Patient denies any fevers, chills.  Upper or lower respiratory illness-like symptoms.  Past Medical History:  Diagnosis Date  . Kidney stone   . Nephrolithiasis 07/2014   left    Patient Active Problem List   Diagnosis Date Noted  . Carpal tunnel syndrome 04/07/2014  . Sciatica of left side 02/03/2012  . Rash 06/04/2011    History reviewed. No pertinent surgical history.      Home Medications    Prior to Admission medications   Medication Sig Start Date End Date Taking? Authorizing Provider  dicyclomine (BENTYL) 20 MG tablet Take 1 tablet (20 mg total) by mouth 2 (two) times daily. 11/30/17   Khatri, Hina, PA-C  esomeprazole (NEXIUM) 40 MG capsule Take 1 capsule (40 mg total) by mouth daily. Patient not taking: Reported on 04/01/2016 08/29/15   Charlestine Night, PA-C  famotidine (PEPCID) 20 MG tablet Take 1 tablet (20 mg total) by mouth 2 (two) times daily. 11/30/17   Khatri, Hina, PA-C  HYDROcodone-acetaminophen (NORCO) 5-325 MG tablet Take 2 tablets by mouth every 4 (four) hours as needed.  04/01/16   Mancel Bale, MD  loperamide (IMODIUM) 2 MG capsule Take 2 capsules (4 mg total) by mouth at bedtime as needed for diarrhea or loose stools. 06/24/18   Derwood Kaplan, MD  ondansetron (ZOFRAN ODT) 8 MG disintegrating tablet Take 1 tablet (8 mg total) by mouth every 8 (eight) hours as needed for nausea. 06/24/18   Derwood Kaplan, MD  promethazine (PHENERGAN) 25 MG suppository Place 1 suppository (25 mg total) rectally every 6 (six) hours as needed for nausea. 06/24/18   Derwood Kaplan, MD  tamsulosin (FLOMAX) 0.4 MG CAPS capsule Take 1 capsule (0.4 mg total) by mouth 2 (two) times daily. Patient not taking: Reported on 04/01/2016 01/17/16   Eber Hong, MD    Family History Family History  Problem Relation Age of Onset  . Diabetes Mother   . Fibromyalgia Mother   . Hypertension Mother   . Diabetes Other     Social History Social History   Tobacco Use  . Smoking status: Current Every Day Smoker    Packs/day: 0.50    Types: Cigarettes  . Smokeless tobacco: Never Used  Substance Use Topics  . Alcohol use: Yes    Comment: minimal   . Drug use: No     Allergies   Amoxicillin   Review of Systems Review of Systems  Constitutional: Positive for activity change.  Cardiovascular: Negative for chest pain.  Gastrointestinal: Positive for abdominal pain, nausea and  vomiting.  Allergic/Immunologic: Negative for immunocompromised state.  Neurological: Negative for dizziness.  All other systems reviewed and are negative.    Physical Exam Updated Vital Signs BP (!) 106/59   Pulse (!) 52   Temp 97.8 F (36.6 C) (Oral)   Resp 16   Ht 5\' 8"  (1.727 m)   Wt 63.5 kg   SpO2 100%   BMI 21.29 kg/m   Physical Exam Vitals signs and nursing note reviewed.  Constitutional:      Appearance: He is well-developed.  HENT:     Head: Atraumatic.  Neck:     Musculoskeletal: Neck supple.  Cardiovascular:     Rate and Rhythm: Normal rate.  Pulmonary:     Effort: Pulmonary  effort is normal.  Abdominal:     Tenderness: There is generalized abdominal tenderness.  Skin:    General: Skin is warm.  Neurological:     Mental Status: He is alert and oriented to person, place, and time.      ED Treatments / Results  Labs (all labs ordered are listed, but only abnormal results are displayed) Labs Reviewed  COMPREHENSIVE METABOLIC PANEL - Abnormal; Notable for the following components:      Result Value   Glucose, Bld 122 (*)    Total Protein 8.2 (*)    Albumin 5.2 (*)    All other components within normal limits  CBC WITH DIFFERENTIAL/PLATELET - Abnormal; Notable for the following components:   WBC 14.6 (*)    Neutro Abs 12.5 (*)    All other components within normal limits  MAGNESIUM    EKG None  Radiology No results found.  Procedures Procedures (including critical care time)  Medications Ordered in ED Medications  lactated ringers bolus 1,000 mL (0 mLs Intravenous Stopped 06/24/18 2135)  ondansetron (ZOFRAN) injection 4 mg (4 mg Intravenous Given 06/24/18 1859)  haloperidol lactate (HALDOL) injection 5 mg (5 mg Intravenous Given 06/24/18 1942)     Initial Impression / Assessment and Plan / ED Course  I have reviewed the triage vital signs and the nursing notes.  Pertinent labs & imaging results that were available during my care of the patient were reviewed by me and considered in my medical decision making (see chart for details).        28 year old comes in a chief complaint of nausea, vomiting and diarrhea.  Is also having generalized abdominal pain.  History and exam is not suggestive of any acute intra-abdominal process including infection or obstruction.  It appears that the symptoms are driven by a benign viral process or foodborne illness.  Patient was given Zofran without significant relief.  Labs overall are reassuring.  Patient was hydrated in the ER and given 5 of Haldol, which resolved his symptoms completely.  Patient is  passed oral challenge and is stable for discharge. Strict ER return precautions have been discussed, and patient is agreeing with the plan and is comfortable with the workup done and the recommendations from the ER.   Final Clinical Impressions(s) / ED Diagnoses   Final diagnoses:  Nausea vomiting and diarrhea  Generalized abdominal pain    ED Discharge Orders         Ordered    ondansetron (ZOFRAN ODT) 8 MG disintegrating tablet  Every 8 hours PRN     06/24/18 2144    promethazine (PHENERGAN) 25 MG suppository  Every 6 hours PRN     06/24/18 2144    loperamide (IMODIUM) 2 MG capsule  At  bedtime PRN     06/24/18 2144           Derwood Kaplan, MD 06/25/18 0005

## 2018-06-24 NOTE — ED Notes (Addendum)
Upon entering room to ask pt if he would like anything to drink, pt states that she just had another episode of emesis.  Dr. Rhunette Croft made aware.

## 2018-06-24 NOTE — ED Notes (Signed)
Pt given a gingerale and took few sips.  Pt instructed to take a few more sips every so often.  Pt expressed understanding.  Pt states that nausea is better.  Dr. Rhunette Croft made aware.

## 2018-09-13 ENCOUNTER — Emergency Department (HOSPITAL_COMMUNITY): Payer: Self-pay

## 2018-09-13 ENCOUNTER — Other Ambulatory Visit: Payer: Self-pay

## 2018-09-13 ENCOUNTER — Emergency Department (HOSPITAL_COMMUNITY)
Admission: EM | Admit: 2018-09-13 | Discharge: 2018-09-13 | Disposition: A | Discharge status: Home / Self Care | Payer: Self-pay | Attending: Emergency Medicine | Admitting: Emergency Medicine

## 2018-09-13 DIAGNOSIS — S0083XA Contusion of other part of head, initial encounter: Secondary | ICD-10-CM

## 2018-09-13 DIAGNOSIS — Y9389 Activity, other specified: Secondary | ICD-10-CM | POA: Insufficient documentation

## 2018-09-13 DIAGNOSIS — F17210 Nicotine dependence, cigarettes, uncomplicated: Secondary | ICD-10-CM | POA: Insufficient documentation

## 2018-09-13 DIAGNOSIS — Z23 Encounter for immunization: Secondary | ICD-10-CM | POA: Insufficient documentation

## 2018-09-13 DIAGNOSIS — Y929 Unspecified place or not applicable: Secondary | ICD-10-CM | POA: Insufficient documentation

## 2018-09-13 DIAGNOSIS — S0181XA Laceration without foreign body of other part of head, initial encounter: Secondary | ICD-10-CM

## 2018-09-13 DIAGNOSIS — Y998 Other external cause status: Secondary | ICD-10-CM | POA: Insufficient documentation

## 2018-09-13 MED ORDER — TETANUS-DIPHTH-ACELL PERTUSSIS 5-2.5-18.5 LF-MCG/0.5 IM SUSP
0.5000 mL | Freq: Once | INTRAMUSCULAR | Status: AC
  Administered 2018-09-13: 0.5 mL via INTRAMUSCULAR
  Filled 2018-09-13: qty 0.5

## 2018-09-13 MED ORDER — FENTANYL CITRATE (PF) 100 MCG/2ML IJ SOLN
100.0000 ug | Freq: Once | INTRAMUSCULAR | Status: AC
  Administered 2018-09-13: 100 ug via INTRAVENOUS
  Filled 2018-09-13: qty 2

## 2018-09-13 MED ORDER — LIDOCAINE-EPINEPHRINE (PF) 2 %-1:200000 IJ SOLN
10.0000 mL | Freq: Once | INTRAMUSCULAR | Status: AC
  Administered 2018-09-13: 10 mL
  Filled 2018-09-13: qty 20

## 2018-09-13 MED ORDER — OXYCODONE HCL 5 MG PO CAPS: 5.0000 mg | ORAL_CAPSULE | Freq: Four times a day (QID) | ORAL | 0 refills | Status: DC | PRN

## 2018-09-13 MED ORDER — OXYCODONE HCL 5 MG PO TABS
5.0000 mg | ORAL_TABLET | Freq: Once | ORAL | Status: AC
  Administered 2018-09-13: 5 mg via ORAL
  Filled 2018-09-13: qty 1

## 2018-09-13 MED ORDER — ONDANSETRON HCL 4 MG/2ML IJ SOLN
4.0000 mg | Freq: Once | INTRAMUSCULAR | Status: AC
  Administered 2018-09-13: 4 mg via INTRAVENOUS
  Filled 2018-09-13: qty 2
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
## 2018-09-13 NOTE — ED Notes (Signed)
Pt discharged from ED; instructions provided and scripts given; Pt encouraged to return to ED if symptoms worsen and to f/u with PCP; Pt verbalized understanding of all instructions 
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
## 2018-09-22 ENCOUNTER — Other Ambulatory Visit: Payer: Self-pay

## 2018-09-22 ENCOUNTER — Encounter (HOSPITAL_COMMUNITY): Payer: Self-pay | Admitting: Emergency Medicine

## 2018-09-22 ENCOUNTER — Ambulatory Visit (HOSPITAL_COMMUNITY)
Admission: EM | Admit: 2018-09-22 | Discharge: 2018-09-22 | Disposition: A | Discharge status: Home / Self Care | Payer: Self-pay | Attending: Family Medicine | Admitting: Family Medicine

## 2018-09-22 DIAGNOSIS — Z4802 Encounter for removal of sutures: Secondary | ICD-10-CM

## 2018-09-22 DIAGNOSIS — H5789 Other specified disorders of eye and adnexa: Secondary | ICD-10-CM

## 2018-09-22 MED ORDER — TETRACAINE HCL 0.5 % OP SOLN
OPHTHALMIC | Status: AC
  Filled 2018-09-22: qty 4

## 2018-09-22 NOTE — ED Triage Notes (Signed)
While removing sutures, patient said his left eye is bothering him, headache, left eye red, frequent tearing.  Notified provider staff patient requesting provider to see

## 2018-09-22 NOTE — ED Triage Notes (Signed)
Patient initially seen in [facility] ED on 09/13/2018  Patient is in [facility] today for suture removal
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
## 2018-09-22 NOTE — Discharge Instructions (Addendum)
Call Dr. Zenia Resides office to be seen today if possible for an eye exam.  Return here or go to ER if your symptoms worsen or you have new concerning symptoms.

## 2020-07-26 ENCOUNTER — Ambulatory Visit (HOSPITAL_COMMUNITY)
Admission: EM | Admit: 2020-07-26 | Discharge: 2020-07-26 | Disposition: A | Discharge status: Home / Self Care | Payer: Self-pay | Attending: Urgent Care | Admitting: Urgent Care

## 2020-07-26 ENCOUNTER — Ambulatory Visit (INDEPENDENT_AMBULATORY_CARE_PROVIDER_SITE_OTHER): Payer: Self-pay

## 2020-07-26 ENCOUNTER — Encounter (HOSPITAL_COMMUNITY): Payer: Self-pay

## 2020-07-26 DIAGNOSIS — M25521 Pain in right elbow: Secondary | ICD-10-CM

## 2020-07-26 DIAGNOSIS — R2231 Localized swelling, mass and lump, right upper limb: Secondary | ICD-10-CM

## 2020-07-26 MED ORDER — NAPROXEN 500 MG PO TABS: 500.0000 mg | ORAL_TABLET | Freq: Two times a day (BID) | ORAL | 0 refills | Status: DC

## 2020-07-26 NOTE — ED Provider Notes (Addendum)
[facility]   MRN: [id number] DOB: [birth year]  Subjective:   Jeffrey Bautista is a 30 y.o. male presenting for 2-week history of persistent right elbow pain with swelling, decreased range of motion.  Patient cannot recall any particular fall, trauma.  He does work for [employer], does a lot of repetitive and heavy lifting, has fast paced work tasks. Cannot recall any particular inciting event.  Denies redness, open wound.  Has used over-the-counter NSAID with minimal relief.  No current facility-administered medications for this encounter.  Current Outpatient Medications:  .  dicyclomine (BENTYL) 20 MG tablet, Take 1 tablet (20 mg total) by mouth 2 (two) times daily., Disp: 20 tablet, Rfl: 0 .  famotidine (PEPCID) 20 MG tablet, Take 1 tablet (20 mg total) by mouth 2 (two) times daily., Disp: 30 tablet, Rfl: 0 .  HYDROcodone-acetaminophen (NORCO) 5-325 MG tablet, Take 2 tablets by mouth every 4 (four) hours as needed., Disp: 20 tablet, Rfl: 0 .  loperamide (IMODIUM) 2 MG capsule, Take 2 capsules (4 mg total) by mouth at bedtime as needed for diarrhea or loose stools., Disp: 12 capsule, Rfl: 0 .  ondansetron (ZOFRAN ODT) 8 MG disintegrating tablet, Take 1 tablet (8 mg total) by mouth every 8 (eight) hours as needed for nausea., Disp: 20 tablet, Rfl: 0 .  oxycodone (OXY-IR) 5 MG capsule, Take 1 capsule (5 mg total) by mouth every 6 (six) hours as needed., Disp: 8 capsule, Rfl: 0 .  promethazine (PHENERGAN) 25 MG suppository, Place 1 suppository (25 mg total) rectally every 6 (six) hours as needed for nausea., Disp: 12 each, Rfl: 0   Allergies  Allergen Reactions  . Amoxicillin Anaphylaxis    Has patient had a PCN reaction causing immediate rash, facial/tongue/throat swelling, SOB or lightheadedness with hypotension: No Has patient had a PCN reaction causing severe rash involving mucus membranes or skin necrosis: Yes Has patient had a PCN reaction that required hospitalization  Yes Has patient had a PCN reaction occurring within the last 10 years: No If all of the above answers are "NO", then may proceed with Cephalosporin use.    Past Medical History:  Diagnosis Date  . Kidney stone   . Nephrolithiasis 07/2014   left     History reviewed. No pertinent surgical history.  Family History  Problem Relation Age of Onset  . Diabetes Mother   . Fibromyalgia Mother   . Hypertension Mother   . Diabetes Other     Social History   Tobacco Use  . Smoking status: Current Every Day Smoker    Packs/day: 0.50    Types: Cigarettes  . Smokeless tobacco: Never Used  Substance Use Topics  . Alcohol use: Yes    Comment: minimal   . Drug use: No    ROS   Objective:   Vitals: BP 107/67 (BP Location: Left Arm)   Pulse 68   Temp 98 F (36.7 C) (Oral)   Resp 17   SpO2 99%   Physical Exam Constitutional:      General: He is not in acute distress.    Appearance: Normal appearance. He is well-developed and normal weight. He is not ill-appearing, toxic-appearing or diaphoretic.  HENT:     Head: Normocephalic and atraumatic.     Right Ear: External ear normal.     Left Ear: External ear normal.     Nose: Nose normal.     Mouth/Throat:     Pharynx: Oropharynx is clear.  Eyes:  General: No scleral icterus.       Right eye: No discharge.        Left eye: No discharge.     Extraocular Movements: Extraocular movements intact.     Pupils: Pupils are equal, round, and reactive to light.  Cardiovascular:     Rate and Rhythm: Normal rate.  Pulmonary:     Effort: Pulmonary effort is normal.  Musculoskeletal:     Right elbow: Swelling present. No deformity, effusion or lacerations. Decreased range of motion (cannot fully extend the elbow, missing about 30% range of motion). Tenderness present in radial head and lateral epicondyle. No medial epicondyle or olecranon process tenderness.     Cervical back: Normal range of motion.  Skin:    General: Skin is  warm and dry.  Neurological:     Mental Status: He is alert and oriented to person, place, and time.     Motor: No weakness.     Coordination: Coordination normal.     Gait: Gait normal.     Deep Tendon Reflexes: Reflexes normal.  Psychiatric:        Mood and Affect: Mood normal.        Behavior: Behavior normal.        Thought Content: Thought content normal.        Judgment: Judgment normal.     DG Elbow Complete Right  Result Date: 07/26/2020 CLINICAL DATA:  Right elbow pain and swelling for 2 weeks EXAM: RIGHT ELBOW - COMPLETE 3+ VIEW COMPARISON:  None. FINDINGS: Frontal, oblique, and lateral views of the right elbow are obtained. No fracture, subluxation, or dislocation. Joint spaces are well preserved. No effusion. IMPRESSION: 1. Unremarkable right elbow. Electronically Signed   By: Sharlet Salina M.D.   On: 07/26/2020 17:24   Applied a 2" Ace wrap to the right elbow.   Assessment and Plan :   PDMP not reviewed this encounter.  1. Right elbow pain     Will manage for inflammatory type pain secondary to the nature of his work. Recommended RICE method, NSAID. Provided with note for work. Also discussed possibility and general management of lateral epicondylitis given his pain over the lateral epicondyle. Patient will consider trying an elbow strap. Counseled patient on potential for adverse effects with medications prescribed/recommended today, ER and return-to-clinic precautions discussed, patient verbalized understanding.     Wallis Bamberg, PA-C 07/26/20 1800

## 2020-07-26 NOTE — ED Triage Notes (Signed)
Pt presents with right elbow swelling and pain X 2 weeks.

## 2020-08-01 ENCOUNTER — Encounter (HOSPITAL_COMMUNITY): Payer: Self-pay | Admitting: Emergency Medicine

## 2020-08-01 ENCOUNTER — Ambulatory Visit (HOSPITAL_COMMUNITY)
Admission: EM | Admit: 2020-08-01 | Discharge: 2020-08-01 | Disposition: A | Discharge status: Home / Self Care | Payer: Self-pay

## 2020-08-01 ENCOUNTER — Other Ambulatory Visit: Payer: Self-pay

## 2020-08-01 DIAGNOSIS — M7711 Lateral epicondylitis, right elbow: Secondary | ICD-10-CM

## 2020-08-01 DIAGNOSIS — G5622 Lesion of ulnar nerve, left upper limb: Secondary | ICD-10-CM

## 2020-08-01 MED ORDER — METHYLPREDNISOLONE 4 MG PO TBPK: ORAL_TABLET | ORAL | 0 refills | Status: AC

## 2020-08-01 NOTE — Discharge Instructions (Signed)
Wear your tennis elbow brace daily when doing repetitive work with your arms.  Follow up with orthopedic doctor if  your employer has not set you up with worker's comp care.

## 2020-08-01 NOTE — ED Triage Notes (Signed)
Pt presents today with c/o of continued pain to right elbow. He was seen here on 07/26/20 for same. He reports that Naproxen irritates his stomach and he is unable to take.

## 2020-08-01 NOTE — ED Provider Notes (Signed)
[facility]    CSN: [id number] Arrival date & time: 08/01/20  1155      History   Chief Complaint Chief Complaint  Patient presents with  . Elbow Pain    right    HPI Jeffrey Bautista is a 30 y.o. male who comes back with recurrent R elbow pain from repetitive work with arms at his job. Has been wearing his tennis elbow brace. Had to take off work last week, but cant afford to do this since he does not have PTO. He has not talk with his supervisor about his current symptoms.     Past Medical History:  Diagnosis Date  . Kidney stone   . Nephrolithiasis 07/2014   left    Patient Active Problem List   Diagnosis Date Noted  . Carpal tunnel syndrome 04/07/2014  . Sciatica of left side 02/03/2012  . Rash 06/04/2011    History reviewed. No pertinent surgical history.     Home Medications    Prior to Admission medications   Medication Sig Start Date End Date Taking? Authorizing Provider  ibuprofen (ADVIL) 200 MG tablet Take 200 mg by mouth every 6 (six) hours as needed.   Yes [provider]  methylPREDNISolone (MEDROL DOSEPAK) 4 MG TBPK tablet As directed 08/01/20  Yes Rodriguez-Southworth, Nettie Elm, PA-C  dicyclomine (BENTYL) 20 MG tablet Take 1 tablet (20 mg total) by mouth 2 (two) times daily. 11/30/17 08/01/20  Khatri, Hina, PA-C  esomeprazole (NEXIUM) 40 MG capsule Take 1 capsule (40 mg total) by mouth daily. Patient not taking: Reported on 04/01/2016 08/29/15 09/13/18  Charlestine Night, PA-C  famotidine (PEPCID) 20 MG tablet Take 1 tablet (20 mg total) by mouth 2 (two) times daily. 11/30/17 08/01/20  Khatri, Hina, PA-C  promethazine (PHENERGAN) 25 MG suppository Place 1 suppository (25 mg total) rectally every 6 (six) hours as needed for nausea. 06/24/18 08/01/20  Derwood Kaplan, MD    Family History Family History  Problem Relation Age of Onset  . Diabetes Mother   . Fibromyalgia Mother   . Hypertension Mother   . Diabetes Other     Social  History Social History   Tobacco Use  . Smoking status: Current Every Day Smoker    Packs/day: 0.50    Types: Cigarettes  . Smokeless tobacco: Never Used  Substance Use Topics  . Alcohol use: Yes    Comment: minimal   . Drug use: No     Allergies   Amoxicillin   Review of Systems Review of Systems  Constitutional: Negative for fever.  Musculoskeletal: Positive for arthralgias. Negative for gait problem.  Skin: Negative for color change, pallor, rash and wound.  Neurological: Positive for numbness. Negative for weakness.       On ring and little finger     Physical Exam Triage Vital Signs ED Triage Vitals  Enc Vitals Group     BP 08/01/20 1117 118/66     Pulse Rate 08/01/20 1117 68     Resp 08/01/20 1117 20     Temp 08/01/20 1117 (!) 96.5 F (35.8 C)     Temp Source 08/01/20 1117 Temporal     SpO2 08/01/20 1117 100 %     Weight --      Height --      Head Circumference --      Peak Flow --      Pain Score 08/01/20 1114 6     Pain Loc --      Pain  Edu? --      Excl. in [place]? --    No data found.  Updated Vital Signs BP 118/66 (BP Location: Left Arm)   Pulse 68   Temp (!) 96.5 F (35.8 C) (Temporal)   Resp 20   SpO2 100%   Visual Acuity Right Eye Distance:   Left Eye Distance:   Bilateral Distance:    Right Eye Near:   Left Eye Near:    Bilateral Near:     Physical Exam Vitals and nursing note reviewed.  Constitutional:      General: He is not in acute distress. HENT:     Head: Normocephalic.     Right Ear: External ear normal.     Left Ear: External ear normal.  Eyes:     General: No scleral icterus.    Conjunctiva/sclera: Conjunctivae normal.  Pulmonary:     Effort: Pulmonary effort is normal.  Musculoskeletal:        General: Normal range of motion.     Cervical back: Neck supple.     Comments: R ELBOW- has swelling of lateral epicondyle which is tender, but also has tenderness on medial epicondyle.   Skin:    General: Skin is warm  and dry.     Capillary Refill: Capillary refill takes less than 2 seconds.     Findings: No rash.  Neurological:     Mental Status: He is alert and oriented to person, place, and time.     Gait: Gait normal.     Deep Tendon Reflexes: Reflexes normal.  Psychiatric:        Mood and Affect: Mood normal.        Behavior: Behavior normal.        Thought Content: Thought content normal.        Judgment: Judgment normal.      UC Treatments / Results  Labs (all labs ordered are listed, but only abnormal results are displayed) Labs Reviewed - No data to display  EKG   Radiology No results found.  Procedures Procedures (including critical care time)  Medications Ordered in UC Medications - No data to display  Initial Impression / Assessment and Plan / UC Course  I have reviewed the triage vital signs and the nursing notes. He still have lateral epicondylitis and may have ulnar nerve impingement. Advised to FU with ortho if getting in to see worker's comp provider this week. May benefit from a local steroid injection.  I placed him on Medrol dose pack.  Also needs to talk with his supervisor about claiming worker's comp and start PT and care for this.    Final Clinical Impressions(s) / UC Diagnoses   Final diagnoses:  Right lateral epicondylitis  Ulnar nerve compression, left     Discharge Instructions     Wear your tennis elbow brace daily when doing repetitive work with your arms.  Follow up with orthopedic doctor if  your employer has not set you up with worker's comp care.     ED Prescriptions    Medication Sig Dispense Auth. Provider   methylPREDNISolone (MEDROL DOSEPAK) 4 MG TBPK tablet As directed 21 tablet Rodriguez-Southworth, Nettie Elm, PA-C     PDMP not reviewed this encounter.   Garey Ham, PA-C 08/01/20 1217

## 2020-08-02 ENCOUNTER — Telehealth (HOSPITAL_COMMUNITY): Payer: Self-pay | Admitting: Family Medicine

## 2020-08-02 NOTE — Telephone Encounter (Signed)
Needs work note. Provided.

## 2020-08-03 ENCOUNTER — Ambulatory Visit (HOSPITAL_COMMUNITY)
Admission: EM | Admit: 2020-08-03 | Discharge: 2020-08-03 | Disposition: A | Discharge status: Home / Self Care | Payer: Self-pay

## 2020-08-03 ENCOUNTER — Encounter (HOSPITAL_COMMUNITY): Payer: Self-pay

## 2020-08-03 DIAGNOSIS — M25521 Pain in right elbow: Secondary | ICD-10-CM

## 2020-08-03 NOTE — ED Provider Notes (Signed)
[facility]    CSN: [id number] Arrival date & time: 08/03/20  1601      History   Chief Complaint Chief Complaint  Patient presents with  . Elbow Pain    HPI Jeffrey Bautista is a 30 y.o. male.   Pt was seen here on 5/4 and 5/10 and treated for elbow pain.  Pt reports he is better and needs to return to work.  Pt states he can not be out.  He is requesting a note to return  He is suppose to see the Orthopaedist in 2 weeks   The history is provided by the patient. No language interpreter was used.    Past Medical History:  Diagnosis Date  . Kidney stone   . Nephrolithiasis 07/2014   left    Patient Active Problem List   Diagnosis Date Noted  . Carpal tunnel syndrome 04/07/2014  . Sciatica of left side 02/03/2012  . Rash 06/04/2011    History reviewed. No pertinent surgical history.     Home Medications    Prior to Admission medications   Medication Sig Start Date End Date Taking? Authorizing Provider  ibuprofen (ADVIL) 200 MG tablet Take 200 mg by mouth every 6 (six) hours as needed.    [provider]  methylPREDNISolone (MEDROL DOSEPAK) 4 MG TBPK tablet As directed 08/01/20   Rodriguez-Southworth, Nettie Elm, PA-C  dicyclomine (BENTYL) 20 MG tablet Take 1 tablet (20 mg total) by mouth 2 (two) times daily. 11/30/17 08/01/20  Khatri, Hina, PA-C  esomeprazole (NEXIUM) 40 MG capsule Take 1 capsule (40 mg total) by mouth daily. Patient not taking: Reported on 04/01/2016 08/29/15 09/13/18  Charlestine Night, PA-C  famotidine (PEPCID) 20 MG tablet Take 1 tablet (20 mg total) by mouth 2 (two) times daily. 11/30/17 08/01/20  Khatri, Hina, PA-C  promethazine (PHENERGAN) 25 MG suppository Place 1 suppository (25 mg total) rectally every 6 (six) hours as needed for nausea. 06/24/18 08/01/20  Derwood Kaplan, MD    Family History Family History  Problem Relation Age of Onset  . Diabetes Mother   . Fibromyalgia Mother   . Hypertension Mother   . Diabetes Other      Social History Social History   Tobacco Use  . Smoking status: Current Every Day Smoker    Packs/day: 0.50    Types: Cigarettes  . Smokeless tobacco: Never Used  Substance Use Topics  . Alcohol use: Yes    Comment: minimal   . Drug use: No     Allergies   Amoxicillin   Review of Systems Review of Systems  All other systems reviewed and are negative.    Physical Exam Triage Vital Signs ED Triage Vitals  Enc Vitals Group     BP 08/03/20 1719 (!) 104/50     Pulse Rate 08/03/20 1719 (!) 59     Resp 08/03/20 1719 16     Temp 08/03/20 1719 98.4 F (36.9 C)     Temp Source 08/03/20 1719 Oral     SpO2 08/03/20 1719 100 %     Weight --      Height --      Head Circumference --      Peak Flow --      Pain Score 08/03/20 1718 4     Pain Loc --      Pain Edu? --      Excl. in [place]? --    No data found.  Updated Vital Signs BP (!) 104/50 (BP  Location: Right Arm)   Pulse (!) 59   Temp 98.4 F (36.9 C) (Oral)   Resp 16   SpO2 100%   Visual Acuity Right Eye Distance:   Left Eye Distance:   Bilateral Distance:    Right Eye Near:   Left Eye Near:    Bilateral Near:     Physical Exam Vitals reviewed.  Musculoskeletal:        General: No swelling or tenderness. Normal range of motion.  Skin:    General: Skin is warm.  Neurological:     General: No focal deficit present.     Mental Status: He is alert.  Psychiatric:        Mood and Affect: Mood normal.      UC Treatments / Results  Labs (all labs ordered are listed, but only abnormal results are displayed) Labs Reviewed - No data to display  EKG   Radiology No results found.  Procedures Procedures (including critical care time)  Medications Ordered in UC Medications - No data to display  Initial Impression / Assessment and Plan / UC Course  I have reviewed the triage vital signs and the nursing notes.  Pertinent labs & imaging results that were available during my care of the patient  were reviewed by me and considered in my medical decision making (see chart for details).     MDM:  Pt advised to keep Orthopaedist appointment, continue prednisone.  Return if any problems.  Final Clinical Impressions(s) / UC Diagnoses   Final diagnoses:  Elbow pain, right     Discharge Instructions     Return if any problems.    ED Prescriptions    None     PDMP not reviewed this encounter.  An After Visit Summary was printed and given to the patient.    Elson Areas, [place] 08/03/20 1747

## 2020-08-03 NOTE — Discharge Instructions (Signed)
Return if any problems.

## 2020-08-03 NOTE — ED Triage Notes (Signed)
Pt reports he needs to be seeing again for pain in the right elbow,. Pt reports elbow pain improved and he needs to go back to work.   Pt reports he tried to set an app op an appt with [facility], but appt is in 2 weeks and he can not wait.

## 2021-06-06 ENCOUNTER — Encounter (HOSPITAL_COMMUNITY): Payer: Self-pay

## 2021-06-06 ENCOUNTER — Ambulatory Visit (HOSPITAL_COMMUNITY)
Admission: EM | Admit: 2021-06-06 | Discharge: 2021-06-06 | Disposition: A | Discharge status: Home / Self Care | Payer: 59 | Attending: Nurse Practitioner | Admitting: Nurse Practitioner

## 2021-06-06 ENCOUNTER — Other Ambulatory Visit: Payer: Self-pay

## 2021-06-06 DIAGNOSIS — J111 Influenza due to unidentified influenza virus with other respiratory manifestations: Secondary | ICD-10-CM | POA: Diagnosis not present

## 2021-06-06 DIAGNOSIS — J029 Acute pharyngitis, unspecified: Secondary | ICD-10-CM

## 2021-06-06 DIAGNOSIS — Z20822 Contact with and (suspected) exposure to covid-19: Secondary | ICD-10-CM | POA: Insufficient documentation

## 2021-06-06 DIAGNOSIS — R509 Fever, unspecified: Secondary | ICD-10-CM | POA: Diagnosis present

## 2021-06-06 LAB — POC INFLUENZA A AND B ANTIGEN (URGENT CARE ONLY)
INFLUENZA A ANTIGEN, POC: POSITIVE — AB
INFLUENZA B ANTIGEN, POC: POSITIVE — AB

## 2021-06-06 LAB — POCT RAPID STREP A, ED / UC: Streptococcus, Group A Screen (Direct): NEGATIVE

## 2021-06-06 MED ORDER — OSELTAMIVIR PHOSPHATE 75 MG PO CAPS: 75.0000 mg | ORAL_CAPSULE | Freq: Two times a day (BID) | ORAL | 0 refills | Status: AC

## 2021-06-06 NOTE — ED Triage Notes (Signed)
Pt presents to the office for sore throat and cough x 2 days. He reports a fever of 104. He is taking tylenol for pain. ?

## 2021-06-06 NOTE — ED Provider Notes (Signed)
?[place] ? ? ? ?CSN: [id number] ?Arrival date & time: 06/06/21  1513 ? ? ?  ? ?History   ?Chief Complaint ?Chief Complaint  ?Patient presents with  ? Cough  ? Sore Throat  ? Fever  ? ? ?HPI ?Jeffrey Bautista is a 31 y.o. male.  ? ?Patient reports upper respiratory symptoms since Sunday.  Endorses fever, cough, sore throat, occasional chest tightness, and decreased appetite.  He has also been more tired than normal.  Denies chest pain, wheezing, congestion, nausea/vomiting, diarrhea, dizziness.  Denies ear pain or drainage, eye redness. Has taken Coricidin with some relief of symptoms.  ? ? ?Past Medical History:  ?Diagnosis Date  ? Kidney stone   ? Nephrolithiasis 07/2014  ? left  ? ? ?Patient Active Problem List  ? Diagnosis Date Noted  ? Carpal tunnel syndrome 04/07/2014  ? Sciatica of left side 02/03/2012  ? Rash 06/04/2011  ? ? ?History reviewed. No pertinent surgical history. ? ? ? ? ?Home Medications   ? ?Prior to Admission medications   ?Medication Sig Start Date End Date Taking? Authorizing Provider  ?oseltamivir (TAMIFLU) 75 MG capsule Take 1 capsule (75 mg total) by mouth every 12 (twelve) hours for 5 days. 06/06/21 06/11/21 Yes Eulogio Bear, NP  ?ibuprofen (ADVIL) 200 MG tablet Take 200 mg by mouth every 6 (six) hours as needed.    [provider]  ?methylPREDNISolone (MEDROL DOSEPAK) 4 MG TBPK tablet As directed 08/01/20   Rodriguez-Southworth, Sunday Spillers, PA-C  ?dicyclomine (BENTYL) 20 MG tablet Take 1 tablet (20 mg total) by mouth 2 (two) times daily. 11/30/17 08/01/20  Khatri, Nicanor Alcon, PA-C  ?esomeprazole (NEXIUM) 40 MG capsule Take 1 capsule (40 mg total) by mouth daily. ?Patient not taking: Reported on 04/01/2016 08/29/15 09/13/18  Dalia Heading, PA-C  ?famotidine (PEPCID) 20 MG tablet Take 1 tablet (20 mg total) by mouth 2 (two) times daily. 11/30/17 08/01/20  Khatri, Nicanor Alcon, PA-C  ?promethazine (PHENERGAN) 25 MG suppository Place 1 suppository (25 mg total) rectally every 6 (six) hours as  needed for nausea. 06/24/18 08/01/20  Varney Biles, MD  ? ? ?Family History ?Family History  ?Problem Relation Age of Onset  ? Diabetes Mother   ? Fibromyalgia Mother   ? Hypertension Mother   ? Diabetes Other   ? ? ?Social History ?Social History  ? ?Tobacco Use  ? Smoking status: Every Day  ?  Packs/day: 0.50  ?  Types: Cigarettes  ? Smokeless tobacco: Never  ?Substance Use Topics  ? Alcohol use: Yes  ?  Comment: minimal   ? Drug use: No  ? ? ? ?Allergies   ?Amoxicillin ? ? ?Review of Systems ?Review of Systems ?Per HPI ? ?Physical Exam ?Triage Vital Signs ?ED Triage Vitals [06/06/21 1608]  ?Enc Vitals Group  ?   BP 123/81  ?   Pulse Rate 99  ?   Resp 16  ?   Temp 99.7 ?F (37.6 ?C)  ?   Temp Source Oral  ?   SpO2 100 %  ?   Weight   ?   Height   ?   Head Circumference   ?   Peak Flow   ?   Pain Score   ?   Pain Loc   ?   Pain Edu?   ?   Excl. in [place]?   ? ?No data found. ? ?Updated Vital Signs ?BP 123/81 (BP Location: Left Arm)   Pulse 99   Temp 99.7 ?F (  37.6 ?C) (Oral)   Resp 16   SpO2 100%  ? ?Visual Acuity ?Right Eye Distance:   ?Left Eye Distance:   ?Bilateral Distance:   ? ?Right Eye Near:   ?Left Eye Near:    ?Bilateral Near:    ? ?Physical Exam ?Vitals and nursing note reviewed.  ?Constitutional:   ?   General: He is not in acute distress. ?   Appearance: He is well-developed. He is not toxic-appearing.  ?HENT:  ?   Head: Normocephalic and atraumatic.  ?   Right Ear: Tympanic membrane and ear canal normal. No drainage, swelling or tenderness. No middle ear effusion. Tympanic membrane is not erythematous.  ?   Left Ear: Tympanic membrane and ear canal normal. No drainage, swelling or tenderness.  No middle ear effusion. Tympanic membrane is not erythematous.  ?   Nose: No congestion or rhinorrhea.  ?   Mouth/Throat:  ?   Mouth: Mucous membranes are moist.  ?   Pharynx: Posterior oropharyngeal erythema present. No oropharyngeal exudate.  ?   Tonsils: No tonsillar exudate. 1+ on the right. 1+ on the left.   ?Eyes:  ?   Extraocular Movements:  ?   Right eye: Normal extraocular motion.  ?   Left eye: Normal extraocular motion.  ?   Pupils: Pupils are equal, round, and reactive to light.  ?Neck:  ?   Thyroid: No thyromegaly.  ?Cardiovascular:  ?   Rate and Rhythm: Normal rate and regular rhythm.  ?Pulmonary:  ?   Effort: Pulmonary effort is normal. No respiratory distress.  ?   Breath sounds: No wheezing, rhonchi or rales.  ?Lymphadenopathy:  ?   Cervical: Cervical adenopathy present.  ?Skin: ?   General: Skin is warm and dry.  ?   Capillary Refill: Capillary refill takes less than 2 seconds.  ?   Coloration: Skin is not pale.  ?   Findings: No erythema or rash.  ?Neurological:  ?   Mental Status: He is alert and oriented to person, place, and time.  ?Psychiatric:     ?   Mood and Affect: Mood normal.     ?   Behavior: Behavior normal.  ? ? ? ?UC Treatments / Results  ?Labs ?(all labs ordered are listed, but only abnormal results are displayed) ?Labs Reviewed  ?POC INFLUENZA A AND B ANTIGEN (URGENT CARE ONLY) - Abnormal; Notable for the following components:  ?    Result Value  ? INFLUENZA A ANTIGEN, POC POSITIVE (*)   ? INFLUENZA B ANTIGEN, POC POSITIVE (*)   ? All other components within normal limits  ?SARS CORONAVIRUS 2 (TAT 6-24 HRS)  ?POCT RAPID STREP A, ED / UC  ? ? ?EKG ? ? ?Radiology ?No results found. ? ?Procedures ?Procedures (including critical care time) ? ?Medications Ordered in UC ?Medications - No data to display ? ?Initial Impression / Assessment and Plan / UC Course  ?I have reviewed the triage vital signs and the nursing notes. ? ?Pertinent labs & imaging results that were available during my care of the patient were reviewed by me and considered in my medical decision making (see chart for details). ? ?  ?Flu A&B positive today.  Treat with Tamiflu.  COVID testing also obtained.  Continue supportive care. Increase fluid intake with water or electrolyte solution like pedialyte. Encouraged acetaminophen  as needed for fever/pain. Encouraged salt water gargling, chloraseptic spray and throat lozenges. Encouraged OTC guaifenesin. Encouraged saline sinus flushes and/or neti with humidified  air.  Discussed symptoms should improve within 1 week; if they do not or if they worsen, seek care.  Note given for work. ? ?Final Clinical Impressions(s) / UC Diagnoses  ? ?Final diagnoses:  ?Influenza  ?Pharyngitis, unspecified etiology  ? ? ? ?Discharge Instructions   ? ?  ?- Your flu test is positive today.  Please start the Tamiflu.  ?- We will let you know if the COVID test is positive.  ?- Rapid strep test is negative.  ?- Please stay out of work until you are fever free without fever reducing medication for 24 hours.  ?- Follow up if your symptoms do not improve after 1 week or if they worsen. ? ? ? ? ?ED Prescriptions   ? ? Medication Sig Dispense Auth. Provider  ? oseltamivir (TAMIFLU) 75 MG capsule Take 1 capsule (75 mg total) by mouth every 12 (twelve) hours for 5 days. 10 capsule Eulogio Bear, NP  ? ?  ? ?PDMP not reviewed this encounter. ?  ?Eulogio Bear, NP ?06/06/21 1715 ? ?

## 2021-06-06 NOTE — Discharge Instructions (Addendum)
-   Your flu test is positive today.  Please start the Tamiflu.  ?- We will let you know if the COVID test is positive.  ?- Rapid strep test is negative.  ?- Please stay out of work until you are fever free without fever reducing medication for 24 hours.  ?- Follow up if your symptoms do not improve after 1 week or if they worsen. ?

## 2021-06-07 LAB — SARS CORONAVIRUS 2 (TAT 6-24 HRS): SARS Coronavirus 2: NEGATIVE
